# Patient Record
Sex: Female | Born: 1990 | Race: White | Hispanic: No | Marital: Single | Smoking: Current every day smoker
Health system: Southern US, Community
[De-identification: ages and names within clinical notes are randomized; demographics above are authoritative.]

## PROBLEM LIST (undated history)

## (undated) DIAGNOSIS — I10 Essential (primary) hypertension: Secondary | ICD-10-CM

## (undated) DIAGNOSIS — F32A Depression, unspecified: Secondary | ICD-10-CM

## (undated) DIAGNOSIS — M797 Fibromyalgia: Secondary | ICD-10-CM

## (undated) DIAGNOSIS — Z8742 Personal history of other diseases of the female genital tract: Secondary | ICD-10-CM

## (undated) DIAGNOSIS — Z8659 Personal history of other mental and behavioral disorders: Secondary | ICD-10-CM

## (undated) DIAGNOSIS — J45909 Unspecified asthma, uncomplicated: Secondary | ICD-10-CM

## (undated) DIAGNOSIS — Z8719 Personal history of other diseases of the digestive system: Secondary | ICD-10-CM

## (undated) DIAGNOSIS — E049 Nontoxic goiter, unspecified: Secondary | ICD-10-CM

## (undated) DIAGNOSIS — M1990 Unspecified osteoarthritis, unspecified site: Secondary | ICD-10-CM

## (undated) DIAGNOSIS — E079 Disorder of thyroid, unspecified: Secondary | ICD-10-CM

## (undated) DIAGNOSIS — K219 Gastro-esophageal reflux disease without esophagitis: Secondary | ICD-10-CM

## (undated) DIAGNOSIS — F419 Anxiety disorder, unspecified: Secondary | ICD-10-CM

## (undated) DIAGNOSIS — G43909 Migraine, unspecified, not intractable, without status migrainosus: Secondary | ICD-10-CM

## (undated) HISTORY — DX: Nontoxic goiter, unspecified: E04.9

## (undated) HISTORY — PX: ABDOMINAL HYSTERECTOMY: SHX81

## (undated) HISTORY — DX: Disorder of thyroid, unspecified: E07.9

## (undated) HISTORY — DX: Personal history of other mental and behavioral disorders: Z86.59

## (undated) HISTORY — DX: Unspecified osteoarthritis, unspecified site: M19.90

## (undated) HISTORY — DX: Personal history of other diseases of the female genital tract: Z87.42

## (undated) HISTORY — PX: HERNIA REPAIR: SHX51

## (undated) HISTORY — DX: Gastro-esophageal reflux disease without esophagitis: K21.9

## (undated) HISTORY — PX: KNEE SURGERY: SHX244

## (undated) HISTORY — DX: Fibromyalgia: M79.7

## (undated) HISTORY — DX: Unspecified asthma, uncomplicated: J45.909

## (undated) HISTORY — DX: Migraine, unspecified, not intractable, without status migrainosus: G43.909

## (undated) HISTORY — DX: Personal history of other diseases of the digestive system: Z87.19

## (undated) HISTORY — DX: Anxiety disorder, unspecified: F41.9

## (undated) HISTORY — DX: Depression, unspecified: F32.A

---

## 2014-05-25 ENCOUNTER — Other Ambulatory Visit: Payer: Self-pay | Admitting: Orthopedic Surgery

## 2014-05-25 DIAGNOSIS — S8992XA Unspecified injury of left lower leg, initial encounter: Secondary | ICD-10-CM

## 2014-05-26 ENCOUNTER — Ambulatory Visit
Admission: RE | Admit: 2014-05-26 | Discharge: 2014-05-26 | Disposition: A | Discharge status: Home / Self Care | Payer: Medicaid Other | Source: Ambulatory Visit | Attending: Orthopedic Surgery | Admitting: Orthopedic Surgery

## 2014-05-26 DIAGNOSIS — S8992XA Unspecified injury of left lower leg, initial encounter: Secondary | ICD-10-CM

## 2016-01-24 IMAGING — CT CT KNEE*L* W/O CM
2 of 4 series · 4 of 14 positions shown, 5 images · non-contrast
Comparison: None.

CLINICAL DATA: Hyperextension injury left knee while trying to put
on his shoe on 05/06/2014. Onset of right knee pain. The patient
heard a pop.

EXAM:
CT OF THE LEFT KNEE WITHOUT CONTRAST
TECHNIQUE: Multidetector CT imaging of the left knee was performed according to
the standard protocol. Multiplanar CT image reconstructions were
also generated.

[Series 4: knee bone · axial · 0.32mm/px · z∈[-66,+4]mm · 2 of 84 slices shown, 3 images]
[im 28/84  soft-tissue]
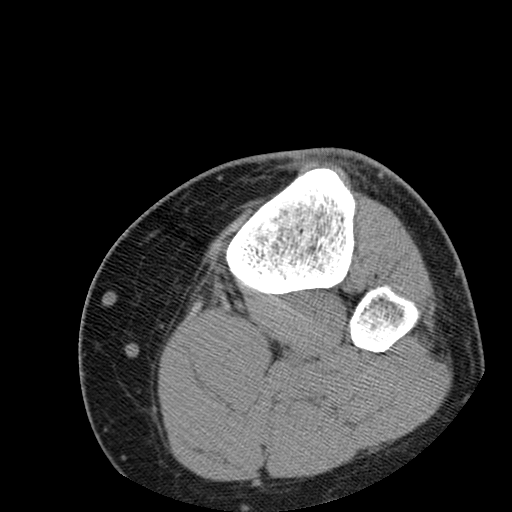
[im 28/84  bone]
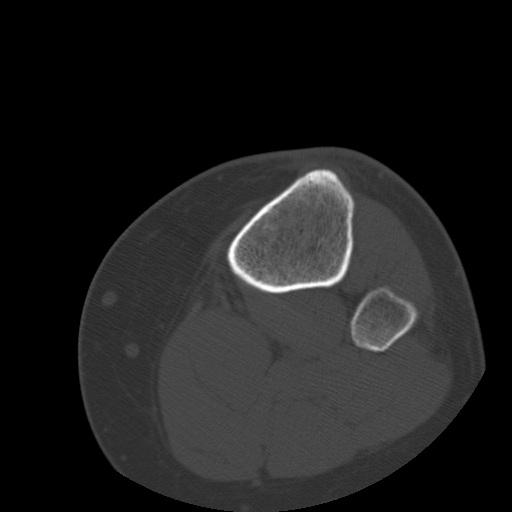
[im 56/84  bone]
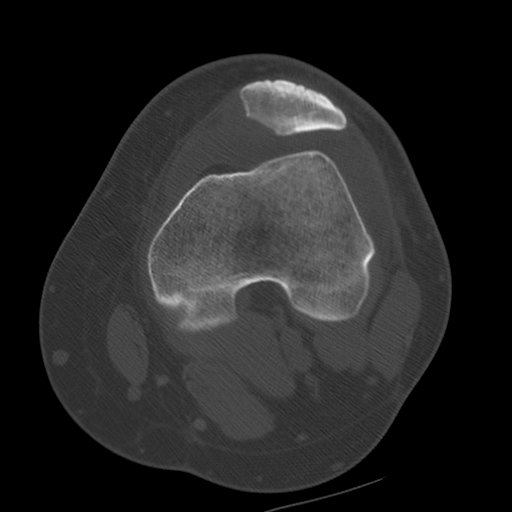

[Series 5: knee soft · axial · 0.32mm/px · z∈[-64,+6]mm · 2 of 85 slices shown]
[im 29/85  soft-tissue]
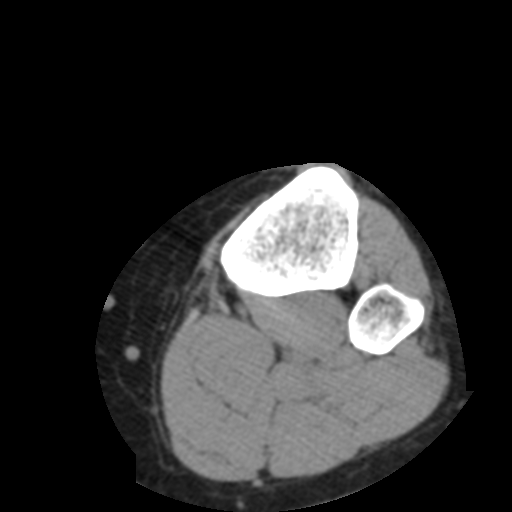
[im 57/85  soft-tissue]
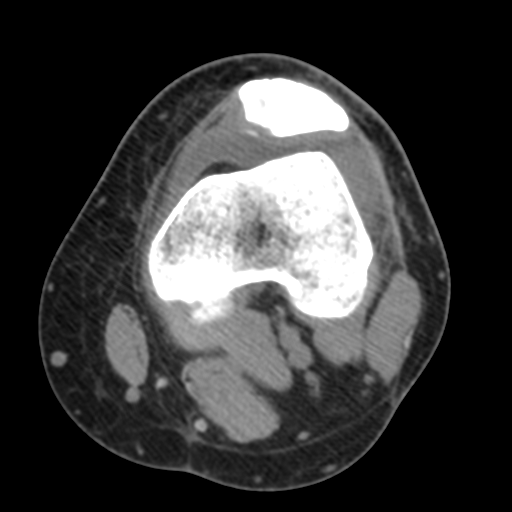

[4 of 14 positions shown; findings below may reference images not displayed]

FINDINGS: A bone fragment is seen along the lateral femoral condyle measuring
1.5 cm AP by 1.3 cm craniocaudal by 0.3 cm transverse. The donor
site is the medial facet of the patella in the mid and lower pole
where a defect is identified. There is lateral tilt of the patella.
The patient has a joint effusion. Except as noted, no fracture is
identified.

The patient has a very shallow trochlear groove. The medial
patellofemoral ligament appears attenuated at its attachment to the
mid and lower pole of the patella. The patellar tendon, quadriceps
tendon and lateral retinaculum are intact. The tibial tubercle,
trochlear groove distance is abnormally increased at 2.1 cm. Is
visualized by CT scan, the menisci and cruciate and collateral
ligaments appear intact.
IMPRESSION: Findings consistent with transient lateral dislocation of the
patella with an associated fracture of the medial patellar facet.
The fracture fragment is positioned along the lateral femoral
trochlea. Shallow trochlear groove and abnormal tibial
tubercle-trochlear groove distance predispose the patient to
patellar dislocation. There may be tearing of the medial
patellofemoral ligament but this would be better evaluated with MRI.

## 2021-06-26 NOTE — Progress Notes (Unsigned)
° °  Office Visit Note  Patient: Linda Gaines             Date of Birth: 1991/01/30           MRN: [id number]             PCP: No primary care provider on file. Referring: Adela Glimpse, NP Visit Date: 07/10/2021 Occupation: @GUAROCC @  Subjective:  No chief complaint on file.   History of Present Illness: Linda Gaines is a 31 y.o. female ***   Activities of Daily Living:  Patient reports morning stiffness for *** {minute/hour:19697}.   Patient {ACTIONS;DENIES/REPORTS:21021675::"Denies"} nocturnal pain.  Difficulty dressing/grooming: {ACTIONS;DENIES/REPORTS:21021675::"Denies"} Difficulty climbing stairs: {ACTIONS;DENIES/REPORTS:21021675::"Denies"} Difficulty getting out of chair: {ACTIONS;DENIES/REPORTS:21021675::"Denies"} Difficulty using hands for taps, buttons, cutlery, and/or writing: {ACTIONS;DENIES/REPORTS:21021675::"Denies"}  No Rheumatology ROS completed.   PMFS History:  There are no problems to display for this patient.   No past medical history on file.  No family history on file. *** The histories are not reviewed yet. Please review them in the "History" navigator section and refresh this SmartLink. Social History   Social History Narrative   Not on file    There is no immunization history on file for this patient.   Objective: Vital Signs: There were no vitals taken for this visit.   Physical Exam   Musculoskeletal Exam: ***  CDAI Exam: CDAI Score: -- Patient Global: --; Provider Global: -- Swollen: --; Tender: -- Joint Exam 07/10/2021   No joint exam has been documented for this visit   There is currently no information documented on the homunculus. Go to the Rheumatology activity and complete the homunculus joint exam.  Investigation: No additional findings.  Imaging: No results found.  Recent Labs: No results found for: WBC, HGB, PLT, NA, K, CL, CO2, GLUCOSE, BUN, CREATININE, BILITOT, ALKPHOS, AST, ALT, PROT, ALBUMIN, CALCIUM, GFRAA,  QFTBGOLD, QFTBGOLDPLUS  Speciality Comments: No specialty comments available.  Procedures:  No procedures performed Allergies: Patient has no allergy information on record.   Assessment / Plan:     Visit Diagnoses: Positive ANA (antinuclear antibody) - 03/26/21: ANA 1:40NH  Thyroid disease  Hepatosplenomegaly  History of diverticulosis  Primary osteoarthritis of left knee  History of bipolar disorder  History of ADHD  PCOS (polycystic ovarian syndrome)  History of gastroesophageal reflux (GERD)  Orders: No orders of the defined types were placed in this encounter.  No orders of the defined types were placed in this encounter.   Face-to-face time spent with patient was *** minutes. Greater than 50% of time was spent in counseling and coordination of care.  Follow-Up Instructions: No follow-ups on file.   14/6/22, PA-C  Note - This record has been created using Dragon software.  Chart creation errors have been sought, but may not always  have been located. Such creation errors do not reflect on  the standard of medical care.

## 2021-07-10 ENCOUNTER — Ambulatory Visit (INDEPENDENT_AMBULATORY_CARE_PROVIDER_SITE_OTHER): Payer: Medicaid Other

## 2021-07-10 ENCOUNTER — Ambulatory Visit (INDEPENDENT_AMBULATORY_CARE_PROVIDER_SITE_OTHER): Payer: Medicaid Other | Admitting: Rheumatology

## 2021-07-10 ENCOUNTER — Encounter: Payer: Self-pay | Admitting: Rheumatology

## 2021-07-10 ENCOUNTER — Other Ambulatory Visit: Payer: Self-pay

## 2021-07-10 ENCOUNTER — Encounter: Payer: Self-pay | Admitting: *Deleted

## 2021-07-10 VITALS — BP 143/76 | HR 88 | Ht 63.0 in | Wt 193.0 lb

## 2021-07-10 DIAGNOSIS — M79672 Pain in left foot: Secondary | ICD-10-CM

## 2021-07-10 DIAGNOSIS — E079 Disorder of thyroid, unspecified: Secondary | ICD-10-CM

## 2021-07-10 DIAGNOSIS — Z8719 Personal history of other diseases of the digestive system: Secondary | ICD-10-CM | POA: Insufficient documentation

## 2021-07-10 DIAGNOSIS — M25562 Pain in left knee: Secondary | ICD-10-CM | POA: Diagnosis not present

## 2021-07-10 DIAGNOSIS — R202 Paresthesia of skin: Secondary | ICD-10-CM

## 2021-07-10 DIAGNOSIS — E282 Polycystic ovarian syndrome: Secondary | ICD-10-CM | POA: Insufficient documentation

## 2021-07-10 DIAGNOSIS — M79671 Pain in right foot: Secondary | ICD-10-CM | POA: Diagnosis not present

## 2021-07-10 DIAGNOSIS — R768 Other specified abnormal immunological findings in serum: Secondary | ICD-10-CM

## 2021-07-10 DIAGNOSIS — M791 Myalgia, unspecified site: Secondary | ICD-10-CM

## 2021-07-10 DIAGNOSIS — G8929 Other chronic pain: Secondary | ICD-10-CM | POA: Diagnosis not present

## 2021-07-10 DIAGNOSIS — M79642 Pain in left hand: Secondary | ICD-10-CM

## 2021-07-10 DIAGNOSIS — R5383 Other fatigue: Secondary | ICD-10-CM

## 2021-07-10 DIAGNOSIS — G4709 Other insomnia: Secondary | ICD-10-CM

## 2021-07-10 DIAGNOSIS — M79641 Pain in right hand: Secondary | ICD-10-CM

## 2021-07-10 DIAGNOSIS — M1712 Unilateral primary osteoarthritis, left knee: Secondary | ICD-10-CM

## 2021-07-10 DIAGNOSIS — Z8639 Personal history of other endocrine, nutritional and metabolic disease: Secondary | ICD-10-CM

## 2021-07-10 DIAGNOSIS — R162 Hepatomegaly with splenomegaly, not elsewhere classified: Secondary | ICD-10-CM | POA: Insufficient documentation

## 2021-07-10 DIAGNOSIS — Z8669 Personal history of other diseases of the nervous system and sense organs: Secondary | ICD-10-CM

## 2021-07-10 DIAGNOSIS — Z8659 Personal history of other mental and behavioral disorders: Secondary | ICD-10-CM | POA: Insufficient documentation

## 2021-07-16 LAB — URINALYSIS, ROUTINE W REFLEX MICROSCOPIC
Bilirubin Urine: NEGATIVE
Glucose, UA: NEGATIVE
Hgb urine dipstick: NEGATIVE
Ketones, ur: NEGATIVE
Leukocytes,Ua: NEGATIVE
Nitrite: NEGATIVE
Protein, ur: NEGATIVE
Specific Gravity, Urine: 1.01 (ref 1.001–1.035)
pH: 6 (ref 5.0–8.0)

## 2021-07-16 LAB — LUPUS ANTICOAGULANT EVAL W/ REFLEX
PTT-LA Screen: 33 s (ref <=40)
dRVVT: 39 s (ref <=45)

## 2021-07-16 LAB — SJOGRENS SYNDROME-A EXTRACTABLE NUCLEAR ANTIBODY: SSA (Ro) (ENA) Antibody, IgG: <1 AI

## 2021-07-16 LAB — CK: Total CK: 117 U/L (ref 29–143)

## 2021-07-16 LAB — BETA-2 GLYCOPROTEIN ANTIBODIES
Beta-2 Glyco 1 IgA: <2 U/mL (ref <20.0)
Beta-2 Glyco 1 IgM: <2 U/mL (ref <20.0)
Beta-2 Glyco I IgG: <2 U/mL (ref <20.0)

## 2021-07-16 LAB — CBC WITH DIFFERENTIAL/PLATELET
Absolute Monocytes: 409 cells/uL (ref 200–950)
Basophils Absolute: 61 cells/uL (ref 0–200)
Basophils Relative: 0.7 %
Eosinophils Absolute: 0 cells/uL — ABNORMAL LOW (ref 15–500)
Eosinophils Relative: 0 %
HCT: 45.4 % — ABNORMAL HIGH (ref 35.0–45.0)
Hemoglobin: 15.3 g/dL (ref 11.7–15.5)
Lymphs Abs: 1496 cells/uL (ref 850–3900)
MCH: 29.9 pg (ref 27.0–33.0)
MCHC: 33.7 g/dL (ref 32.0–36.0)
MCV: 88.8 fL (ref 80.0–100.0)
MPV: 10.2 fL (ref 7.5–12.5)
Monocytes Relative: 4.7 %
Neutro Abs: 6734 cells/uL (ref 1500–7800)
Neutrophils Relative %: 77.4 %
Platelets: 192 10*3/uL (ref 140–400)
RBC: 5.11 10*6/uL — ABNORMAL HIGH (ref 3.80–5.10)
RDW: 13 % (ref 11.0–15.0)
Total Lymphocyte: 17.2 %
WBC: 8.7 10*3/uL (ref 3.8–10.8)

## 2021-07-16 LAB — COMPLETE METABOLIC PANEL WITH GFR
AG Ratio: 2.4 (calc) (ref 1.0–2.5)
ALT: 21 U/L (ref 6–29)
AST: 16 U/L (ref 10–30)
Albumin: 5 g/dL (ref 3.6–5.1)
Alkaline phosphatase (APISO): 52 U/L (ref 31–125)
BUN: 9 mg/dL (ref 7–25)
CO2: 27 mmol/L (ref 20–32)
Calcium: 9.5 mg/dL (ref 8.6–10.2)
Chloride: 106 mmol/L (ref 98–110)
Creat: 0.8 mg/dL (ref 0.50–0.97)
Globulin: 2.1 g/dL (calc) (ref 1.9–3.7)
Glucose, Bld: 88 mg/dL (ref 65–99)
Potassium: 4 mmol/L (ref 3.5–5.3)
Sodium: 140 mmol/L (ref 135–146)
Total Bilirubin: 0.6 mg/dL (ref 0.2–1.2)
Total Protein: 7.1 g/dL (ref 6.1–8.1)
eGFR: 100 mL/min/{1.73_m2} (ref >=60)

## 2021-07-16 LAB — SEDIMENTATION RATE: Sed Rate: 2 mm/h (ref 0–20)

## 2021-07-16 LAB — ANTI-NUCLEAR AB-TITER (ANA TITER): ANA Titer 1: 1:80 {titer} — ABNORMAL HIGH

## 2021-07-16 LAB — EPSTEIN-BARR VIRUS VCA ANTIBODY PANEL
EBV NA IgG: 33.5 U/mL — ABNORMAL HIGH
EBV VCA IgG: >750 U/mL — ABNORMAL HIGH
EBV VCA IgM: <36 U/mL

## 2021-07-16 LAB — ANA: Anti Nuclear Antibody (ANA): POSITIVE — AB

## 2021-07-16 LAB — RNP ANTIBODY: Ribonucleic Protein(ENA) Antibody, IgG: <1 AI

## 2021-07-16 LAB — ANTI-SCLERODERMA ANTIBODY: Scleroderma (Scl-70) (ENA) Antibody, IgG: <1 AI

## 2021-07-16 LAB — C3 AND C4
C3 Complement: 138 mg/dL (ref 83–193)
C4 Complement: 29 mg/dL (ref 15–57)

## 2021-07-16 LAB — TSH: TSH: 1.77 mIU/L

## 2021-07-16 LAB — CYCLIC CITRUL PEPTIDE ANTIBODY, IGG: Cyclic Citrullin Peptide Ab: <16 UNITS

## 2021-07-16 LAB — CARDIOLIPIN ANTIBODIES, IGG, IGM, IGA
Anticardiolipin IgA: <2 APL-U/mL (ref <20.0)
Anticardiolipin IgG: <2 GPL-U/mL (ref <20.0)
Anticardiolipin IgM: <2 MPL-U/mL (ref <20.0)

## 2021-07-16 LAB — ANTI-SMITH ANTIBODY: ENA SM Ab Ser-aCnc: <1 AI

## 2021-07-16 LAB — ANTI-DNA ANTIBODY, DOUBLE-STRANDED: ds DNA Ab: <1 IU/mL

## 2021-07-16 LAB — SJOGRENS SYNDROME-B EXTRACTABLE NUCLEAR ANTIBODY: SSB (La) (ENA) Antibody, IgG: 2.1 AI — AB

## 2021-07-16 LAB — ANGIOTENSIN CONVERTING ENZYME: Angiotensin-Converting Enzyme: 9 U/L (ref 9–67)

## 2021-07-16 LAB — THYROGLOBULIN ANTIBODY: Thyroglobulin Ab: 2 IU/mL — ABNORMAL HIGH (ref <=1)

## 2021-07-16 LAB — THYROID PEROXIDASE ANTIBODY: Thyroperoxidase Ab SerPl-aCnc: 2 IU/mL (ref <9)

## 2021-07-16 LAB — RHEUMATOID FACTOR: Rheumatoid fact SerPl-aCnc: <14 IU/mL (ref <14)

## 2021-07-16 NOTE — Progress Notes (Signed)
I will discuss results at the follow-up visit.

## 2021-07-18 NOTE — Progress Notes (Signed)
? ?Office Visit Note ? ?Patient: Linda Gaines             ?Date of Birth: May 18, 1990           ?MRN: [id number]             ?PCP: Adela Glimpseranford, Tonya, NP ?Referring: No ref. provider found ?Visit Date: 07/31/2021 ?Occupation: @GUAROCC @ ? ?Subjective:  ?Pain in all the joints and muscles ? ?History of Present Illness: Linda Gaines is a 31 y.o. female with history of positive ANA and generalized pain.  She states she continues to have pain in all of her muscles.  She has ongoing pain and discomfort in all of her joints especially in the neck area, around her elbows, bilateral hands, hips, knees and her feet.  She has not noticed any joint swelling.  She continues to have tingling in the tip of her fingers.  She states she has not noticed any discoloration in her hands or feet.  She continues to have chronic insomnia and fatigue. ? ?Activities of Daily Living:  ?Patient reports morning stiffness for 1 hour.   ?Patient Reports nocturnal pain.  ?Difficulty dressing/grooming: Denies ?Difficulty climbing stairs: Reports ?Difficulty getting out of chair: Denies ?Difficulty using hands for taps, buttons, cutlery, and/or writing: Reports ? ?Review of Systems  ?Constitutional:  Positive for fatigue.  ?HENT:  Positive for mouth dryness. Negative for nose dryness.   ?Eyes:  Positive for itching. Negative for pain and dryness.  ?Respiratory:  Negative for difficulty breathing.   ?Cardiovascular:  Negative for chest pain and palpitations.  ?Gastrointestinal:  Positive for constipation. Negative for blood in stool and diarrhea.  ?Endocrine: Negative for increased urination.  ?Genitourinary:  Negative for difficulty urinating.  ?Musculoskeletal:  Positive for joint pain, joint pain, joint swelling, myalgias, morning stiffness, muscle tenderness and myalgias.  ?Skin:  Positive for redness. Negative for color change and rash.  ?Allergic/Immunologic: Negative for susceptible to infections.  ?Neurological:  Positive for numbness and  headaches. Negative for dizziness, memory loss and weakness.  ?Hematological:  Positive for bruising/bleeding tendency.  ?Psychiatric/Behavioral:  Negative for confusion.   ? ?PMFS History:  ?Patient Active Problem List  ? Diagnosis Date Noted  ? History of gastroesophageal reflux (GERD) 07/10/2021  ? PCOS (polycystic ovarian syndrome) 07/10/2021  ? History of ADHD 07/10/2021  ? History of bipolar disorder 07/10/2021  ? History of diverticulosis 07/10/2021  ? Hepatosplenomegaly 07/10/2021  ?  ?Past Medical History:  ?Diagnosis Date  ? History of bipolar disorder   ? History of diverticulosis   ? History of gastroesophageal reflux (GERD)   ? History of PCOS   ? Thyroid disease   ?  ?Family History  ?Problem Relation Age of Onset  ? Hypertension Mother   ? High Cholesterol Mother   ? Diabetes Mother   ? Anxiety disorder Mother   ? High Cholesterol Father   ? Hypertension Father   ? Healthy Son   ? Healthy Son   ? Healthy Daughter   ? Fainting Daughter   ? Anxiety disorder Daughter   ? Depression Daughter   ? Healthy Daughter   ? ?Past Surgical History:  ?Procedure Laterality Date  ? ABDOMINAL HYSTERECTOMY    ? CESAREAN SECTION    ? x3  ? HERNIA REPAIR    ? KNEE SURGERY Left   ? ?Social History  ? ?Social History Narrative  ? Not on file  ? ? ?There is no immunization history on file for this patient.  ? ?Objective: ?  Vital Signs: BP 133/82 (BP Location: Left Arm, Patient Position: Sitting, Cuff Size: Large)   Pulse 92   Resp 13   Ht 5\' 3"  (1.6 m)   Wt 197 lb 3.2 oz (89.4 kg)   BMI 34.93 kg/m?   ? ?Physical Exam ?Vitals and nursing note reviewed.  ?Constitutional:   ?   Appearance: She is well-developed.  ?HENT:  ?   Head: Normocephalic and atraumatic.  ?Eyes:  ?   Conjunctiva/sclera: Conjunctivae normal.  ?Cardiovascular:  ?   Rate and Rhythm: Normal rate and regular rhythm.  ?   Heart sounds: Normal heart sounds.  ?Pulmonary:  ?   Effort: Pulmonary effort is normal.  ?   Breath sounds: Normal breath sounds.   ?Abdominal:  ?   General: Bowel sounds are normal.  ?   Palpations: Abdomen is soft.  ?Musculoskeletal:  ?   Cervical back: Normal range of motion.  ?Lymphadenopathy:  ?   Cervical: No cervical adenopathy.  ?Skin: ?   General: Skin is warm and dry.  ?   Capillary Refill: Capillary refill takes less than 2 seconds.  ?Neurological:  ?   Mental Status: She is alert and oriented to person, place, and time.  ?Psychiatric:     ?   Behavior: Behavior normal.  ?  ? ?Musculoskeletal Exam: C-spine, thoracic and lumbar spine with good range of motion.  Shoulder joints, elbow joints, wrist joints, MCPs PIPs and DIPs with good range of motion with no synovitis.  Hip joints, knee joints, ankles, MTPs and PIPs with good range of motion with no synovitis.  She had bilateral trapezius spasm.  She had tenderness over bilateral lateral epicondyle region, costochondral region, trochanteric area, medial aspect of her knees. ? ?CDAI Exam: ?CDAI Score: -- ?Patient Global: --; Provider Global: -- ?Swollen: --; Tender: -- ?Joint Exam 07/31/2021  ? ?No joint exam has been documented for this visit  ? ?There is currently no information documented on the homunculus. Go to the Rheumatology activity and complete the homunculus joint exam. ? ?Investigation: ?No additional findings. ? ?Imaging: ?XR Foot 2 Views Left ? ?Result Date: 07/10/2021 ?No MTP, PIP or DIP narrowing was noted.  No intertarsal, tibiotalar or subtalar joint space narrowing was noted.  Inferior and posterior calcaneal spurs were noted. Impression: X-rays of the foot was unremarkable except for inferior and posterior calcaneal spurs. ? ?XR Foot 2 Views Right ? ?Result Date: 07/10/2021 ?No MTP, PIP or DIP narrowing was noted.  No intertarsal, tibiotalar or subtalar joint space narrowing was noted.  Inferior and posterior calcaneal spurs were noted. Impression: X-rays of the foot was unremarkable except for inferior and posterior calcaneal spurs. ? ?XR Hand 2 View Left ? ?Result  Date: 07/10/2021 ?No CMC, MCP, PIP or DIP narrowing was noted.  Cortical defect was noted in the fifth proximal phalanx due to previous injury.  No intercarpal or radiocarpal joint space narrowing was noted.  No erosive changes were noted. Impression: Unremarkable x-ray of the hand except for the posttraumatic changes. ? ?XR Hand 2 View Right ? ?Result Date: 07/10/2021 ?No CMC, PIP, DIP, MCP, intercarpal or radiocarpal joint space narrowing was noted.  No erosive changes were noted. Impression: Unremarkable x-ray of the hand. ? ?XR KNEE 3 VIEW LEFT ? ?Result Date: 07/10/2021 ?No medial or lateral compartment narrowing was noted.  No patellofemoral narrowing was noted.  No chondrocalcinosis was noted. Impression: Unremarkable x-ray of the knee joint.  ? ?Recent Labs: ?Lab Results  ?Component Value Date  ?  WBC 8.7 07/10/2021  ? HGB 15.3 07/10/2021  ? PLT 192 07/10/2021  ? NA 140 07/10/2021  ? K 4.0 07/10/2021  ? CL 106 07/10/2021  ? CO2 27 07/10/2021  ? GLUCOSE 88 07/10/2021  ? BUN 9 07/10/2021  ? CREATININE 0.80 07/10/2021  ? BILITOT 0.6 07/10/2021  ? AST 16 07/10/2021  ? ALT 21 07/10/2021  ? PROT 7.1 07/10/2021  ? CALCIUM 9.5 07/10/2021  ? ?July 10, 2021 ANA 1: 80NS, SSB antibody positive, (SSA, double-stranded DNA, Smith, RNP, SCL 70 negative), C3-C4 normal, anticardiolipin negative, beta-2 GP 1 negative, lupus anticoagulant negative, RF negative, anti-CCP negative, ACE 9, EBV IgG positive, IgM negative, TPO negative, thyroglobulin antibody positive, TSH normal, CK117 ? ?Speciality Comments: No specialty comments available. ? ?Procedures:  ?No procedures performed ?Allergies: Penicillin g and Amoxicillin  ? ?Assessment / Plan:     ?Visit Diagnoses: Positive ANA (antinuclear antibody) - +ANA, +La,h/o fatigue, arthralgias, intermittent rash. -I did usual discussion with the patient regarding her labs.  She has low titer ANA and positive SSB antibody.  SSA antibody is negative.  I do not see any synovitis on  examination.  She had good capillary refill with no nailbed capillary changes.  No telangiectasias were noted.  At this point I do not see any features of autoimmune disease.  She has history of mild sicca symptoms which is mo

## 2021-07-31 ENCOUNTER — Ambulatory Visit (INDEPENDENT_AMBULATORY_CARE_PROVIDER_SITE_OTHER): Payer: Medicaid Other | Admitting: Rheumatology

## 2021-07-31 ENCOUNTER — Encounter: Payer: Self-pay | Admitting: Rheumatology

## 2021-07-31 VITALS — BP 133/82 | HR 92 | Resp 13 | Ht 63.0 in | Wt 197.2 lb

## 2021-07-31 DIAGNOSIS — R768 Other specified abnormal immunological findings in serum: Secondary | ICD-10-CM

## 2021-07-31 DIAGNOSIS — M79671 Pain in right foot: Secondary | ICD-10-CM

## 2021-07-31 DIAGNOSIS — M79641 Pain in right hand: Secondary | ICD-10-CM | POA: Diagnosis not present

## 2021-07-31 DIAGNOSIS — R202 Paresthesia of skin: Secondary | ICD-10-CM

## 2021-07-31 DIAGNOSIS — Z8719 Personal history of other diseases of the digestive system: Secondary | ICD-10-CM

## 2021-07-31 DIAGNOSIS — M79642 Pain in left hand: Secondary | ICD-10-CM

## 2021-07-31 DIAGNOSIS — E282 Polycystic ovarian syndrome: Secondary | ICD-10-CM

## 2021-07-31 DIAGNOSIS — Z8669 Personal history of other diseases of the nervous system and sense organs: Secondary | ICD-10-CM

## 2021-07-31 DIAGNOSIS — Z8659 Personal history of other mental and behavioral disorders: Secondary | ICD-10-CM

## 2021-07-31 DIAGNOSIS — M2242 Chondromalacia patellae, left knee: Secondary | ICD-10-CM

## 2021-07-31 DIAGNOSIS — Z8269 Family history of other diseases of the musculoskeletal system and connective tissue: Secondary | ICD-10-CM

## 2021-07-31 DIAGNOSIS — M7918 Myalgia, other site: Secondary | ICD-10-CM

## 2021-07-31 DIAGNOSIS — R162 Hepatomegaly with splenomegaly, not elsewhere classified: Secondary | ICD-10-CM

## 2021-07-31 DIAGNOSIS — Z8639 Personal history of other endocrine, nutritional and metabolic disease: Secondary | ICD-10-CM

## 2021-07-31 DIAGNOSIS — G4709 Other insomnia: Secondary | ICD-10-CM

## 2021-07-31 DIAGNOSIS — R5383 Other fatigue: Secondary | ICD-10-CM

## 2021-07-31 DIAGNOSIS — M79672 Pain in left foot: Secondary | ICD-10-CM

## 2021-07-31 DIAGNOSIS — M797 Fibromyalgia: Secondary | ICD-10-CM

## 2021-07-31 NOTE — Patient Instructions (Signed)
Please get labs 2 weeks prior to your next appointment. ? ?Knee Exercises ?Ask your health care provider which exercises are safe for you. Do exercises exactly as told by your health care provider and adjust them as directed. It is normal to feel mild stretching, pulling, tightness, or discomfort as you do these exercises. Stop right away if you feel sudden pain or your pain gets worse. Do not begin these exercises until told by your health care provider. ?Stretching and range-of-motion exercises ?These exercises warm up your muscles and joints and improve the movement and flexibility of your knee. These exercises also help to relieve pain and swelling. ?Knee extension, prone ? ?Lie on your abdomen (prone position) on a bed. ?Place your left / right knee just beyond the edge of the surface so your knee is not on the bed. You can put a towel under your left / right thigh just above your kneecap for comfort. ?Relax your leg muscles and allow gravity to straighten your knee (extension). You should feel a stretch behind your left / right knee. ?Hold this position for __________ seconds. ?Scoot up so your knee is supported between repetitions. ?Repeat __________ times. Complete this exercise __________ times a day. ?Knee flexion, active ? ?Lie on your back with both legs straight. If this causes back discomfort, bend your left / right knee so your foot is flat on the floor. ?Slowly slide your left / right heel back toward your buttocks. Stop when you feel a gentle stretch in the front of your knee or thigh (flexion). ?Hold this position for __________ seconds. ?Slowly slide your left / right heel back to the starting position. ?Repeat __________ times. Complete this exercise __________ times a day. ?Quadriceps stretch, prone ? ?Lie on your abdomen on a firm surface, such as a bed or padded floor. ?Bend your left / right knee and hold your ankle. If you cannot reach your ankle or pant leg, loop a belt around your foot and  grab the belt instead. ?Gently pull your heel toward your buttocks. Your knee should not slide out to the side. You should feel a stretch in the front of your thigh and knee (quadriceps). ?Hold this position for __________ seconds. ?Repeat __________ times. Complete this exercise __________ times a day. ?Hamstring, supine ? ?Lie on your back (supine position). ?Loop a belt or towel over the ball of your left / right foot. The ball of your foot is on the walking surface, right under your toes. ?Straighten your left / right knee and slowly pull on the belt to raise your leg until you feel a gentle stretch behind your knee (hamstring). ?Do not let your knee bend while you do this. ?Keep your other leg flat on the floor. ?Hold this position for __________ seconds. ?Repeat __________ times. Complete this exercise __________ times a day. ?Strengthening exercises ?These exercises build strength and endurance in your knee. Endurance is the ability to use your muscles for a long time, even after they get tired. ?Quadriceps, isometric ?This exercise strengthens the muscles in front of your thigh (quadriceps) without moving your knee joint (isometric). ?Lie on your back with your left / right leg extended and your other knee bent. Put a rolled towel or small pillow under your knee if told by your health care provider. ?Slowly tense the muscles in the front of your left / right thigh. You should see your kneecap slide up toward your hip or see increased dimpling just above the knee. This  motion will push the back of the knee toward the floor. ?For __________ seconds, hold the muscle as tight as you can without increasing your pain. ?Relax the muscles slowly and completely. ?Repeat __________ times. Complete this exercise __________ times a day. ?Straight leg raises ?This exercise strengthens the muscles in front of your thigh (quadriceps) and the muscles that move your hips (hip flexors). ?Lie on your back with your left /  right leg extended and your other knee bent. ?Tense the muscles in the front of your left / right thigh. You should see your kneecap slide up or see increased dimpling just above the knee. Your thigh may even shake a bit. ?Keep these muscles tight as you raise your leg 4-6 inches (10-15 cm) off the floor. Do not let your knee bend. ?Hold this position for __________ seconds. ?Keep these muscles tense as you lower your leg. ?Relax your muscles slowly and completely after each repetition. ?Repeat __________ times. Complete this exercise __________ times a day. ?Hamstring, isometric ? ?Lie on your back on a firm surface. ?Bend your left / right knee about __________ degrees. ?Dig your left / right heel into the surface as if you are trying to pull it toward your buttocks. Tighten the muscles in the back of your thighs (hamstring) to "dig" as hard as you can without increasing any pain. ?Hold this position for __________ seconds. ?Release the tension gradually and allow your muscles to relax completely for __________ seconds after each repetition. ?Repeat __________ times. Complete this exercise __________ times a day. ?Hamstring curls ?If told by your health care provider, do this exercise while wearing ankle weights. Begin with __________lb / kg weights. Then increase the weight by 1 lb (0.5 kg) increments. Do not wear ankle weights that are more than __________lb / kg. ?Lie on your abdomen with your legs straight. ?Bend your left / right knee as far as you can without feeling pain. Keep your hips flat against the floor. ?Hold this position for __________ seconds. ?Slowly lower your leg to the starting position. ?Repeat __________ times. Complete this exercise __________ times a day. ?Squats ?This exercise strengthens the muscles in front of your thigh and knee (quadriceps). ?Stand in front of a table, with your feet and knees pointing straight ahead. You may rest your hands on the table for balance but not for  support. ?Slowly bend your knees and lower your hips like you are going to sit in a chair. ?Keep your weight over your heels, not over your toes. ?Keep your lower legs upright so they are parallel with the table legs. ?Do not let your hips go lower than your knees. ?Do not bend lower than told by your health care provider. ?If your knee pain increases, do not bend as low. ?Hold the squat position for __________ seconds. ?Slowly push with your legs to return to standing. Do not use your hands to pull yourself to standing. ?Repeat __________ times. Complete this exercise __________ times a day. ?Wall slides ?This exercise strengthens the muscles in front of your thigh and knee (quadriceps). ?Lean your back against a smooth wall or door, and walk your feet out 18-24 inches (46-61 cm) from it. ?Place your feet hip-width apart. ?Slowly slide down the wall or door until your knees bend __________ degrees. Keep your knees over your heels, not over your toes. Keep your knees in line with your hips. ?Hold this position for __________ seconds. ?Repeat __________ times. Complete this exercise __________ times a day. ?  Straight leg raises, side-lying ?This exercise strengthens the muscles that rotate the leg at the hip and move it away from your body (hip abductors). ?Lie on your side with your left / right leg in the top position. Lie so your head, shoulder, knee, and hip line up. You may bend your bottom knee to help you keep your balance. ?Roll your hips slightly forward so your hips are stacked directly over each other and your left / right knee is facing forward. ?Leading with your heel, lift your top leg 4-6 inches (10-15 cm). You should feel the muscles in your outer hip lifting. ?Do not let your foot drift forward. ?Do not let your knee roll toward the ceiling. ?Hold this position for __________ seconds. ?Slowly return your leg to the starting position. ?Let your muscles relax completely after each repetition. ?Repeat  __________ times. Complete this exercise __________ times a day. ?Straight leg raises, prone ?This exercise stretches the muscles that move your hips away from the front of the pelvis (hip extensors). ?Lie on yo

## 2021-08-06 ENCOUNTER — Other Ambulatory Visit: Payer: Self-pay | Admitting: Hematology and Oncology

## 2021-08-06 DIAGNOSIS — R161 Splenomegaly, not elsewhere classified: Secondary | ICD-10-CM

## 2021-08-07 ENCOUNTER — Ambulatory Visit: Payer: Medicaid Other | Admitting: Hematology and Oncology

## 2021-08-07 ENCOUNTER — Inpatient Hospital Stay (INDEPENDENT_AMBULATORY_CARE_PROVIDER_SITE_OTHER): Payer: Medicaid Other | Admitting: Hematology and Oncology

## 2021-08-07 ENCOUNTER — Encounter: Payer: Self-pay | Admitting: Hematology and Oncology

## 2021-08-07 ENCOUNTER — Other Ambulatory Visit: Payer: Medicaid Other

## 2021-08-07 ENCOUNTER — Other Ambulatory Visit: Payer: Self-pay

## 2021-08-07 ENCOUNTER — Inpatient Hospital Stay: Payer: Medicaid Other | Attending: Hematology and Oncology

## 2021-08-07 VITALS — BP 137/65 | HR 72 | Temp 98.1°F | Resp 18 | Ht 63.0 in | Wt 196.8 lb

## 2021-08-07 DIAGNOSIS — M797 Fibromyalgia: Secondary | ICD-10-CM | POA: Diagnosis not present

## 2021-08-07 DIAGNOSIS — R161 Splenomegaly, not elsewhere classified: Secondary | ICD-10-CM

## 2021-08-08 ENCOUNTER — Encounter: Payer: Self-pay | Admitting: Hematology and Oncology

## 2021-08-08 LAB — BASIC METABOLIC PANEL
BUN: 12 (ref 4–21)
CO2: 22 (ref 13–22)
Chloride: 105 (ref 99–108)
Creatinine: 0.7 (ref 0.5–1.1)
Glucose: 97
Potassium: 3.8 mEq/L (ref 3.5–5.1)
Sodium: 139 (ref 137–147)

## 2021-08-08 LAB — HEPATIC FUNCTION PANEL
ALT: 26 U/L (ref 7–35)
AST: 20 (ref 13–35)
Alkaline Phosphatase: 43 (ref 25–125)
Bilirubin, Total: 0.6

## 2021-08-08 LAB — CBC: RBC: 4.88 (ref 3.87–5.11)

## 2021-08-08 LAB — COMPREHENSIVE METABOLIC PANEL
Albumin: 4.6 (ref 3.5–5.0)
Calcium: 9.2 (ref 8.7–10.7)

## 2021-08-08 LAB — CBC AND DIFFERENTIAL
HCT: 42 (ref 36–46)
Hemoglobin: 14.4 (ref 12.0–16.0)
Neutrophils Absolute: 7.17
Platelets: 211 10*3/uL (ref 150–400)
WBC: 10.1

## 2021-08-08 NOTE — Progress Notes (Cosign Needed)
[facility]  [address] [place],  [place]  [address] [phone]  Clinic Day:  08/08/2021  Referring physician: Pollyann Savoy, MD   REASON FOR CONSULTATION:  Hepatosplenomegaly  HISTORY OF PRESENT ILLNESS:  Linda Gaines is a 30 y.o. female with a history of hepatosplenomegaly who is referred in consultation by Pollyann Savoy, MD for assessment and management. Linda Gaines has had history of enlarged liver and spleen dating back to June 2021. Most recent imaging was last month after a fall down the stairs. Results from this CT revealed hepatomegaly measuring 21.1 cm with no suspicious hepatic lesion and similar splenomegaly measuring 14 cm without splenic injury of perisplenic hematoma. She was evaluated by GI with no specific recommendations. She was also evaluated by rheumatology for a positive ANA and found to have no autoimmune disorder.   Today, she denies fever, chills, nausea or vomiting. She denies shortness of breath, chest pain, or cough. She denies issue with bowel or bladder. Medical history is significant for thyroid disease, ADHD, bipolar, depression, obesity, osteoarthritis of the left knee, PCOS, diverticulosis, and GERD. Surgical history is significant for c-section, hernia surgery, left knee surgery and hysterectomy. She does not report any family history. She has 4 children and lives with her aunt. She is a current every day smoker at 1 ppd.    REVIEW OF SYSTEMS:  Review of Systems  Constitutional:  Negative for appetite change, chills, diaphoresis, fatigue, fever and unexpected weight change.  HENT:   Negative for hearing loss, lump/mass, mouth sores, nosebleeds, sore throat, tinnitus, trouble swallowing and voice change.   Eyes:  Negative for eye problems and icterus.  Respiratory:  Negative for chest tightness, cough, hemoptysis, shortness of breath and wheezing.   Cardiovascular:  Negative for chest pain, leg swelling and  palpitations.  Gastrointestinal:  Negative for abdominal distention, abdominal pain, blood in stool, constipation, diarrhea, nausea, rectal pain and vomiting.  Endocrine: Negative for hot flashes.  Genitourinary:  Negative for bladder incontinence, difficulty urinating, dyspareunia, dysuria, frequency, hematuria and nocturia.   Musculoskeletal:  Positive for arthralgias and myalgias. Negative for back pain, flank pain, gait problem, neck pain and neck stiffness.  Skin:  Negative for itching, rash and wound.  Neurological:  Negative for dizziness, extremity weakness, gait problem, headaches, light-headedness, numbness, seizures and speech difficulty.  Hematological:  Negative for adenopathy. Does not bruise/bleed easily.  Psychiatric/Behavioral:  Negative for confusion, decreased concentration, depression, sleep disturbance and suicidal ideas. The patient is not nervous/anxious.     VITALS:  Blood pressure 137/65, pulse 72, temperature 98.1 F (36.7 C), temperature source Oral, resp. rate 18, height 5\' 3"  (1.6 m), weight 196 lb 12.8 oz (89.3 kg), SpO2 98 %.  Wt Readings from Last 3 Encounters:  08/07/21 196 lb 12.8 oz (89.3 kg)  07/31/21 197 lb 3.2 oz (89.4 kg)  07/10/21 193 lb (87.5 kg)    Body mass index is 34.86 kg/m.  Performance status (ECOG): 1 - Symptomatic but completely ambulatory  PHYSICAL EXAM:  Physical Exam Constitutional:      General: She is not in acute distress.    Appearance: Normal appearance. She is normal weight. She is not ill-appearing, toxic-appearing or diaphoretic.  HENT:     Head: Normocephalic and atraumatic.     Right Ear: Tympanic membrane normal.     Left Ear: Tympanic membrane normal.     Nose: Nose normal. No congestion or rhinorrhea.     Mouth/Throat:     Mouth: Mucous membranes  are moist.     Pharynx: Oropharynx is clear. No oropharyngeal exudate or posterior oropharyngeal erythema.  Eyes:     General: No scleral icterus.       Right eye: No  discharge.        Left eye: No discharge.     Extraocular Movements: Extraocular movements intact.     Conjunctiva/sclera: Conjunctivae normal.     Pupils: Pupils are equal, round, and reactive to light.  Neck:     Vascular: No carotid bruit.  Cardiovascular:     Rate and Rhythm: Normal rate and regular rhythm.     Heart sounds: No murmur heard.   No friction rub. No gallop.  Pulmonary:     Effort: Pulmonary effort is normal. No respiratory distress.     Breath sounds: Normal breath sounds. No stridor. No wheezing, rhonchi or rales.  Chest:     Chest wall: No tenderness.  Abdominal:     General: Abdomen is flat. Bowel sounds are normal. There is no distension.     Palpations: There is no mass.     Tenderness: There is no abdominal tenderness. There is no right CVA tenderness, left CVA tenderness, guarding or rebound.     Hernia: No hernia is present.  Musculoskeletal:        General: No swelling, tenderness, deformity or signs of injury. Normal range of motion.     Cervical back: Normal range of motion and neck supple. No rigidity or tenderness.     Right lower leg: No edema.     Left lower leg: No edema.  Lymphadenopathy:     Cervical: No cervical adenopathy.  Skin:    General: Skin is warm and dry.     Capillary Refill: Capillary refill takes less than 2 seconds.     Coloration: Skin is not jaundiced or pale.     Findings: No bruising, erythema, lesion or rash.  Neurological:     General: No focal deficit present.     Mental Status: She is alert and oriented to person, place, and time. Mental status is at baseline.     Cranial Nerves: No cranial nerve deficit.     Sensory: No sensory deficit.     Motor: No weakness.     Coordination: Coordination normal.     Gait: Gait normal.     Deep Tendon Reflexes: Reflexes normal.  Psychiatric:        Mood and Affect: Mood normal.        Behavior: Behavior normal.        Thought Content: Thought content normal.        Judgment:  Judgment normal.     LABS:      Latest Ref Rng & Units 08/08/2021   12:00 AM 07/10/2021    9:17 AM  CBC  WBC  10.1      8.7    Hemoglobin 12.0 - 16.0 14.4      15.3    Hematocrit 36 - 46 42      45.4    Platelets 150 - 400 K/uL 211      192       This result is from an external source.      Latest Ref Rng & Units 08/08/2021   12:00 AM 07/10/2021    9:17 AM  CMP  Glucose 65 - 99 mg/dL  88    BUN 4 - 21 12      9     Creatinine 0.5 -  1.1 0.7      0.80    Sodium 137 - 147 139      140    Potassium 3.5 - 5.1 mEq/L 3.8      4.0    Chloride 99 - 108 105      106    CO2 13 - 22 22      27     Calcium 8.7 - 10.7 9.2      9.5    Total Protein 6.1 - 8.1 g/dL  7.1    Total Bilirubin 0.2 - 1.2 mg/dL  0.6    Alkaline Phos 25 - 125 43        AST 13 - 35 20      16    ALT 7 - 35 U/L 26      21       This result is from an external source.     No results found for: CEA1 / No results found for: CEA1 No results found for: PSA1 No results found for: MWN027 No results found for: CAN125  No results found for: TOTALPROTELP, ALBUMINELP, A1GS, A2GS, BETS, BETA2SER, GAMS, MSPIKE, SPEI No results found for: TIBC, FERRITIN, IRONPCTSAT No results found for: LDH  STUDIES:  XR Foot 2 Views Left  Result Date: 07/10/2021 No MTP, PIP or DIP narrowing was noted.  No intertarsal, tibiotalar or subtalar joint space narrowing was noted.  Inferior and posterior calcaneal spurs were noted. Impression: X-rays of the foot was unremarkable except for inferior and posterior calcaneal spurs.  XR Foot 2 Views Right  Result Date: 07/10/2021 No MTP, PIP or DIP narrowing was noted.  No intertarsal, tibiotalar or subtalar joint space narrowing was noted.  Inferior and posterior calcaneal spurs were noted. Impression: X-rays of the foot was unremarkable except for inferior and posterior calcaneal spurs.  XR Hand 2 View Left  Result Date: 07/10/2021 No CMC, MCP, PIP or DIP narrowing was noted.  Cortical defect  was noted in the fifth proximal phalanx due to previous injury.  No intercarpal or radiocarpal joint space narrowing was noted.  No erosive changes were noted. Impression: Unremarkable x-ray of the hand except for the posttraumatic changes.  XR Hand 2 View Right  Result Date: 07/10/2021 No CMC, PIP, DIP, MCP, intercarpal or radiocarpal joint space narrowing was noted.  No erosive changes were noted. Impression: Unremarkable x-ray of the hand.  XR KNEE 3 VIEW LEFT  Result Date: 07/10/2021 No medial or lateral compartment narrowing was noted.  No patellofemoral narrowing was noted.  No chondrocalcinosis was noted. Impression: Unremarkable x-ray of the knee joint.     HISTORY:   Past Medical History:  Diagnosis Date   Anxiety    Asthma    Depression    Fibromyalgia    GERD (gastroesophageal reflux disease)    Goiter    History of bipolar disorder    History of diverticulosis    History of gastroesophageal reflux (GERD)    History of PCOS    Migraines    Osteoarthritis    Thyroid disease     Past Surgical History:  Procedure Laterality Date   ABDOMINAL HYSTERECTOMY     CESAREAN SECTION     x3   HERNIA REPAIR     KNEE SURGERY Left     Family History  Problem Relation Age of Onset   Hypertension Mother    High Cholesterol Mother    Diabetes Mother    Anxiety disorder Mother  High Cholesterol Father    Hypertension Father    Healthy Son    Healthy Son    Healthy Daughter    Fainting Daughter    Anxiety disorder Daughter    Depression Daughter    Healthy Daughter     Social History:  reports that she has been smoking cigarettes. She has been smoking an average of 1 pack per day. She has never been exposed to tobacco smoke. She has never used smokeless tobacco. She reports that she does not currently use alcohol. She reports that she does not use drugs.The patient is alone  today.  Allergies:  Allergies  Allergen Reactions   Penicillin G Anaphylaxis, Rash and  Swelling   Amoxicillin     Current Medications: Current Outpatient Medications  Medication Sig Dispense Refill   AIMOVIG 70 MG/ML SOAJ SMARTSIG:1 SUB-Q Once a Month     ALPRAZolam (NIRAVAM) 0.25 MG dissolvable tablet Take by mouth.     amphetamine-dextroamphetamine (ADDERALL) 30 MG tablet Take 1 tablet by mouth 2 (two) times daily.     FLUoxetine (PROZAC) 40 MG capsule Take 40 mg by mouth daily.     lisinopril (ZESTRIL) 5 MG tablet Take 5 mg by mouth daily.     montelukast (SINGULAIR) 10 MG tablet      ondansetron (ZOFRAN-ODT) 4 MG disintegrating tablet Take 4 mg by mouth 2 (two) times daily as needed.     pantoprazole (PROTONIX) 40 MG tablet Take 40 mg by mouth daily.     propranolol (INDERAL) 40 MG tablet Take 40 mg by mouth daily.     SYMBICORT 160-4.5 MCG/ACT inhaler SMARTSIG:2 Puff(s) By Mouth Twice Daily     VENTOLIN HFA 108 (90 Base) MCG/ACT inhaler SMARTSIG:1-2 Puff(s) By Mouth Every 4-6 Hours PRN     VRAYLAR 1.5 MG capsule Take 1.5 mg by mouth daily.     No current facility-administered medications for this visit.     ASSESSMENT & PLAN:   Assessment:  Linda Gaines is a 31 y.o. female with a long standing history of hepatosplenomegaly dating back to 2021. Images have been stable from June 2021 to March 2023. CBC and CMP today are unremarkable. We discussed at length the potential causes for an enlarged liver including alcohol abuse, hepatitis or cancer. She has none of these risk factors at present. We discussed concerning symptoms such as vomiting blood or yellowing of the skin and eyes. We also discussed an enlarged spleen and the potential causes including infection or cancer. Again, she has no risk factors and has had workup in the past for lupus which is negative. We discussed concerning symptoms including trauma to the abdomen, increasing abdominal pain and fever. She did inquire about surgical intervention and we discussed the pros and cons. She will be getting routine  scans with her PCP.  Plan: 1.  She will continue follow up with her PCP, GI and rheumatologist. We will see her back on an as needed basis.   I discussed the assessment and treatment plan with the patient.  The patient was provided an opportunity to ask questions and all were answered.  The patient agreed with the plan and demonstrated an understanding of the instructions.  The patient was advised to call back if the symptoms worsen or if the condition fails to improve as anticipated.  Thank you for the opportunity      Pascal Lux, NP

## 2022-01-09 NOTE — Progress Notes (Deleted)
Office Visit Note  Patient: Linda Gaines             Date of Birth: [birth year]           MRN: [id number]             PCP: [facility] Referring: [facility] Visit Date: 01/23/2022 Occupation: @GUAROCC @  Subjective:  No chief complaint on file.   History of Present Illness: Linda Gaines is a 31 y.o. female ***   Activities of Daily Living:  Patient reports morning stiffness for *** {minute/hour:19697}.   Patient {ACTIONS;DENIES/REPORTS:21021675::"Denies"} nocturnal pain.  Difficulty dressing/grooming: {ACTIONS;DENIES/REPORTS:21021675::"Denies"} Difficulty climbing stairs: {ACTIONS;DENIES/REPORTS:21021675::"Denies"} Difficulty getting out of chair: {ACTIONS;DENIES/REPORTS:21021675::"Denies"} Difficulty using hands for taps, buttons, cutlery, and/or writing: {ACTIONS;DENIES/REPORTS:21021675::"Denies"}  No Rheumatology ROS completed.   PMFS History:  Patient Active Problem List   Diagnosis Date Noted  . Fibromyalgia 08/07/2021  . History of gastroesophageal reflux (GERD) 07/10/2021  . PCOS (polycystic ovarian syndrome) 07/10/2021  . History of ADHD 07/10/2021  . History of bipolar disorder 07/10/2021  . History of diverticulosis 07/10/2021  . Hepatosplenomegaly 07/10/2021    Past Medical History:  Diagnosis Date  . Anxiety   . Asthma   . Depression   . Fibromyalgia   . GERD (gastroesophageal reflux disease)   . Goiter   . History of bipolar disorder   . History of diverticulosis   . History of gastroesophageal reflux (GERD)   . History of PCOS   . Migraines   . Osteoarthritis   . Thyroid disease     Family History  Problem Relation Age of Onset  . Hypertension Mother   . High Cholesterol Mother   . Diabetes Mother   . Anxiety disorder Mother   . High Cholesterol Father   . Hypertension Father   . Healthy Son   . Healthy Son   . Healthy Daughter   . Fainting Daughter   . Anxiety disorder Daughter    . Depression Daughter   . Healthy Daughter    Past Surgical History:  Procedure Laterality Date  . ABDOMINAL HYSTERECTOMY    . CESAREAN SECTION     x3  . HERNIA REPAIR    . KNEE SURGERY Left    Social History   Social History Narrative  . Not on file    There is no immunization history on file for this patient.   Objective: Vital Signs: There were no vitals taken for this visit.   Physical Exam   Musculoskeletal Exam: ***  CDAI Exam: CDAI Score: -- Patient Global: --; Provider Global: -- Swollen: --; Tender: -- Joint Exam 01/23/2022   No joint exam has been documented for this visit   There is currently no information documented on the homunculus. Go to the Rheumatology activity and complete the homunculus joint exam.  Investigation: No additional findings.  Imaging: No results found.  Recent Labs: Lab Results  Component Value Date   WBC 10.1 08/08/2021   HGB 14.4 08/08/2021   PLT 211 08/08/2021   NA 139 08/08/2021   K 3.8 08/08/2021   CL 105 08/08/2021   CO2 22 08/08/2021   GLUCOSE 88 07/10/2021   BUN 12 08/08/2021   CREATININE 0.7 08/08/2021   BILITOT 0.6 07/10/2021   ALKPHOS 43 08/08/2021   AST 20 08/08/2021   ALT 26 08/08/2021   PROT 7.1 07/10/2021   ALBUMIN 4.6 08/08/2021   CALCIUM 9.2 08/08/2021    Speciality Comments: No specialty comments available.  Procedures:  No procedures performed Allergies: Penicillin g and Amoxicillin   Assessment / Plan:     Visit Diagnoses: No diagnosis found.  Orders: No orders of the defined types were placed in this encounter.  No orders of the defined types were placed in this encounter.   Face-to-face time spent with patient was *** minutes. Greater than 50% of time was spent in counseling and coordination of care.  Follow-Up Instructions: No follow-ups on file.   Earnestine Mealing, CMA  Note - This record has been created using Editor, commissioning.  Chart creation errors have been sought, but  may not always  have been located. Such creation errors do not reflect on  the standard of medical care.

## 2022-01-23 ENCOUNTER — Ambulatory Visit: Payer: Medicaid Other | Attending: Rheumatology | Admitting: Rheumatology

## 2022-01-23 DIAGNOSIS — M79672 Pain in left foot: Secondary | ICD-10-CM

## 2022-01-23 DIAGNOSIS — M79642 Pain in left hand: Secondary | ICD-10-CM

## 2022-01-23 DIAGNOSIS — R768 Other specified abnormal immunological findings in serum: Secondary | ICD-10-CM

## 2022-01-23 DIAGNOSIS — Z8639 Personal history of other endocrine, nutritional and metabolic disease: Secondary | ICD-10-CM

## 2022-01-23 DIAGNOSIS — R5383 Other fatigue: Secondary | ICD-10-CM

## 2022-01-23 DIAGNOSIS — Z8269 Family history of other diseases of the musculoskeletal system and connective tissue: Secondary | ICD-10-CM

## 2022-01-23 DIAGNOSIS — Z8719 Personal history of other diseases of the digestive system: Secondary | ICD-10-CM

## 2022-01-23 DIAGNOSIS — M2242 Chondromalacia patellae, left knee: Secondary | ICD-10-CM

## 2022-01-23 DIAGNOSIS — Z8659 Personal history of other mental and behavioral disorders: Secondary | ICD-10-CM

## 2022-01-23 DIAGNOSIS — M797 Fibromyalgia: Secondary | ICD-10-CM

## 2022-01-23 DIAGNOSIS — Z8669 Personal history of other diseases of the nervous system and sense organs: Secondary | ICD-10-CM

## 2022-01-23 DIAGNOSIS — R202 Paresthesia of skin: Secondary | ICD-10-CM

## 2022-01-23 DIAGNOSIS — E282 Polycystic ovarian syndrome: Secondary | ICD-10-CM

## 2022-01-23 DIAGNOSIS — R162 Hepatomegaly with splenomegaly, not elsewhere classified: Secondary | ICD-10-CM

## 2022-01-23 DIAGNOSIS — G4709 Other insomnia: Secondary | ICD-10-CM

## 2022-07-21 NOTE — Progress Notes (Deleted)
Office Visit Note  Patient: Linda Gaines             Date of Birth: [birth year]           MRN: [id number]             PCP: [facility] Referring: Darrol Jump, NP Visit Date: 07/31/2022 Occupation: @GUAROCC @  Subjective:  No chief complaint on file.   History of Present Illness: Linda Gaines is a 32 y.o. female ***     Activities of Daily Living:  Patient reports morning stiffness for *** {minute/hour:19697}.   Patient {ACTIONS;DENIES/REPORTS:21021675::"Denies"} nocturnal pain.  Difficulty dressing/grooming: {ACTIONS;DENIES/REPORTS:21021675::"Denies"} Difficulty climbing stairs: {ACTIONS;DENIES/REPORTS:21021675::"Denies"} Difficulty getting out of chair: {ACTIONS;DENIES/REPORTS:21021675::"Denies"} Difficulty using hands for taps, buttons, cutlery, and/or writing: {ACTIONS;DENIES/REPORTS:21021675::"Denies"}  No Rheumatology ROS completed.   PMFS History:  Patient Active Problem List   Diagnosis Date Noted   Fibromyalgia 08/07/2021   History of gastroesophageal reflux (GERD) 07/10/2021   PCOS (polycystic ovarian syndrome) 07/10/2021   History of ADHD 07/10/2021   History of bipolar disorder 07/10/2021   History of diverticulosis 07/10/2021   Hepatosplenomegaly 07/10/2021    Past Medical History:  Diagnosis Date   Anxiety    Asthma    Depression    Fibromyalgia    GERD (gastroesophageal reflux disease)    Goiter    History of bipolar disorder    History of diverticulosis    History of gastroesophageal reflux (GERD)    History of PCOS    Migraines    Osteoarthritis    Thyroid disease     Family History  Problem Relation Age of Onset   Hypertension Mother    High Cholesterol Mother    Diabetes Mother    Anxiety disorder Mother    High Cholesterol Father    Hypertension Father    Healthy Son    Healthy Son    Healthy Daughter    Fainting Daughter    Anxiety disorder Daughter    Depression Daughter     Healthy Daughter    Past Surgical History:  Procedure Laterality Date   ABDOMINAL HYSTERECTOMY     CESAREAN SECTION     x3   HERNIA REPAIR     KNEE SURGERY Left    Social History   Social History Narrative   Not on file    There is no immunization history on file for this patient.   Objective: Vital Signs: There were no vitals taken for this visit.   Physical Exam   Musculoskeletal Exam: ***  CDAI Exam: CDAI Score: -- Patient Global: --; Provider Global: -- Swollen: --; Tender: -- Joint Exam 07/31/2022   No joint exam has been documented for this visit   There is currently no information documented on the homunculus. Go to the Rheumatology activity and complete the homunculus joint exam.  Investigation: No additional findings.  Imaging: No results found.  Recent Labs: Lab Results  Component Value Date   WBC 10.1 08/08/2021   HGB 14.4 08/08/2021   PLT 211 08/08/2021   NA 139 08/08/2021   K 3.8 08/08/2021   CL 105 08/08/2021   CO2 22 08/08/2021   GLUCOSE 88 07/10/2021   BUN 12 08/08/2021   CREATININE 0.7 08/08/2021   BILITOT 0.6 07/10/2021   ALKPHOS 43 08/08/2021   AST 20 08/08/2021   ALT 26 08/08/2021   PROT 7.1 07/10/2021   ALBUMIN 4.6 08/08/2021   CALCIUM 9.2 08/08/2021    July 10, 2021 EBV IgG  positive, IgM negative, ANA 1: 80NS, SSB 2.1, (SCL 70, RNP, Smith, SSA, dsDNA negative), C3-C4 normal, anticardiolipin negative, beta-2 GP 1 negative, lupus anticoagulant negative, CK1 17, TSH normal, sed rate 2, RF negative, anti-CCP negative, TPO negative, thyroglobulin antibody 2, ACE 9  Speciality Comments: No specialty comments available.  Procedures:  No procedures performed Allergies: Penicillin g and Amoxicillin   Assessment / Plan:     Visit Diagnoses: Positive ANA (antinuclear antibody) - Positive ANA, positive SSB history of fatigue, arthralgia, intermittent rash.  Pain in both hands - X-rays were unremarkable.  Paresthesia of both hands  - Patient is planning to see a neurologist.  Chondromalacia patellae, left knee - Mild chondromalacia patella was noted.  Patient was referred to physical therapy.  Pain in both feet - X-rays were unremarkable except for calcaneal spurs.  Fibromyalgia - Patient was referred to physical therapy at the last visit.  Other insomnia  Other fatigue  History of diverticulosis  History of gastroesophageal reflux (GERD)  Hepatosplenomegaly  History of ADHD  History of bipolar disorder  History of migraine  History of goiter - Positive thyroglobulin antibody  PCOS (polycystic ovarian syndrome)  Family history of systemic lupus erythematosus-first cousin  Orders: No orders of the defined types were placed in this encounter.  No orders of the defined types were placed in this encounter.   Face-to-face time spent with patient was *** minutes. Greater than 50% of time was spent in counseling and coordination of care.  Follow-Up Instructions: No follow-ups on file.   Bo Merino, MD  Note - This record has been created using Editor, commissioning.  Chart creation errors have been sought, but may not always  have been located. Such creation errors do not reflect on  the standard of medical care.

## 2022-07-31 ENCOUNTER — Ambulatory Visit: Payer: Medicaid Other | Admitting: Rheumatology

## 2022-07-31 DIAGNOSIS — E282 Polycystic ovarian syndrome: Secondary | ICD-10-CM

## 2022-07-31 DIAGNOSIS — Z8639 Personal history of other endocrine, nutritional and metabolic disease: Secondary | ICD-10-CM

## 2022-07-31 DIAGNOSIS — Z8719 Personal history of other diseases of the digestive system: Secondary | ICD-10-CM

## 2022-07-31 DIAGNOSIS — M79671 Pain in right foot: Secondary | ICD-10-CM

## 2022-07-31 DIAGNOSIS — M2242 Chondromalacia patellae, left knee: Secondary | ICD-10-CM

## 2022-07-31 DIAGNOSIS — Z8659 Personal history of other mental and behavioral disorders: Secondary | ICD-10-CM

## 2022-07-31 DIAGNOSIS — G4709 Other insomnia: Secondary | ICD-10-CM

## 2022-07-31 DIAGNOSIS — Z8669 Personal history of other diseases of the nervous system and sense organs: Secondary | ICD-10-CM

## 2022-07-31 DIAGNOSIS — Z8269 Family history of other diseases of the musculoskeletal system and connective tissue: Secondary | ICD-10-CM

## 2022-07-31 DIAGNOSIS — R5383 Other fatigue: Secondary | ICD-10-CM

## 2022-07-31 DIAGNOSIS — R202 Paresthesia of skin: Secondary | ICD-10-CM

## 2022-07-31 DIAGNOSIS — M79641 Pain in right hand: Secondary | ICD-10-CM

## 2022-07-31 DIAGNOSIS — R162 Hepatomegaly with splenomegaly, not elsewhere classified: Secondary | ICD-10-CM

## 2022-07-31 DIAGNOSIS — R768 Other specified abnormal immunological findings in serum: Secondary | ICD-10-CM

## 2022-07-31 DIAGNOSIS — M797 Fibromyalgia: Secondary | ICD-10-CM

## 2022-08-12 NOTE — Progress Notes (Signed)
Office Visit Note  Patient: Linda Gaines             Date of Birth: December 06, 1990           MRN: [id number]             PCP: [facility] Referring: Adela Glimpse, NP Visit Date: 08/26/2022 Occupation: @GUAROCC @  Subjective:  Pain in joints and muscles  History of Present Illness: Linda Gaines is a 32 y.o. female with history of positive ANA and fibromyalgia syndrome.  She returns today after her last visit on July 31, 2021.  She states she has been having increased pain in her hips which goes down into her legs and also pain in her lower abdominal region.  She continues to have discomfort in her neck and trapezius region.  She also has lower back pain which has been going on for many years.  She states she could not go for physical therapy.  She states her hands have been tingling and get numb.  She had an appointment with the neurology for evaluation.  She has to schedule a follow-up appointment.  She gives history of dry mouth, joint pain and muscle pain.  She has been experiencing pain and discomfort in her right knee joint for the last 6 months.  She denies any Raynaud's phenomenon.  She continues to smoke but she has cut back.    Activities of Daily Living:  Patient reports morning stiffness for 1 hour.   Patient Reports nocturnal pain.  Difficulty dressing/grooming: Denies Difficulty climbing stairs: Reports Difficulty getting out of chair: Reports Difficulty using hands for taps, buttons, cutlery, and/or writing: Reports  Review of Systems  Constitutional:  Positive for fatigue.  HENT:  Positive for mouth dryness. Negative for mouth sores.   Eyes:  Negative for dryness.  Respiratory:  Negative for shortness of breath.   Cardiovascular:  Negative for chest pain and palpitations.  Gastrointestinal:  Negative for blood in stool, constipation and diarrhea.  Endocrine: Negative for increased urination.  Genitourinary:  Negative for  difficulty urinating.  Musculoskeletal:  Positive for joint pain, joint pain, myalgias, morning stiffness, muscle tenderness and myalgias.  Skin:  Positive for sensitivity to sunlight. Negative for color change and rash.  Allergic/Immunologic: Negative for susceptible to infections.  Neurological:  Negative for headaches.  Hematological:  Negative for swollen glands.  Psychiatric/Behavioral:  Positive for sleep disturbance. Negative for depressed mood. The patient is nervous/anxious.     PMFS History:  Patient Active Problem List   Diagnosis Date Noted   Fibromyalgia 08/07/2021   History of gastroesophageal reflux (GERD) 07/10/2021   PCOS (polycystic ovarian syndrome) 07/10/2021   History of ADHD 07/10/2021   History of bipolar disorder 07/10/2021   History of diverticulosis 07/10/2021   Hepatosplenomegaly 07/10/2021    Past Medical History:  Diagnosis Date   Anxiety    Asthma    Depression    Fibromyalgia    GERD (gastroesophageal reflux disease)    Goiter    History of bipolar disorder    History of diverticulosis    History of gastroesophageal reflux (GERD)    History of PCOS    Migraines    Osteoarthritis    Thyroid disease     Family History  Problem Relation Age of Onset   Hypertension Mother    High Cholesterol Mother    Diabetes Mother    Anxiety disorder Mother    High Cholesterol Father    Hypertension  Father    Healthy Son    Healthy Son    Healthy Daughter    Fainting Daughter    Anxiety disorder Daughter    Depression Daughter    Healthy Daughter    Past Surgical History:  Procedure Laterality Date   ABDOMINAL HYSTERECTOMY     CESAREAN SECTION     x3   HERNIA REPAIR     KNEE SURGERY Left    Social History   Social History Narrative   Not on file    There is no immunization history on file for this patient.   Objective: Vital Signs: BP 123/85 (BP Location: Left Arm, Patient Position: Sitting, Cuff Size: Large)   Pulse 81   Resp 17    Ht 5\' 3"  (1.6 m)   Wt 200 lb (90.7 kg)   BMI 35.43 kg/m    Physical Exam Vitals and nursing note reviewed.  Constitutional:      Appearance: She is well-developed.  HENT:     Head: Normocephalic and atraumatic.  Eyes:     Conjunctiva/sclera: Conjunctivae normal.  Cardiovascular:     Rate and Rhythm: Normal rate and regular rhythm.     Heart sounds: Normal heart sounds.  Pulmonary:     Effort: Pulmonary effort is normal.     Breath sounds: Normal breath sounds.  Abdominal:     General: Bowel sounds are normal.     Palpations: Abdomen is soft.  Musculoskeletal:     Cervical back: Normal range of motion.  Lymphadenopathy:     Cervical: No cervical adenopathy.  Skin:    General: Skin is warm and dry.     Capillary Refill: Capillary refill takes less than 2 seconds.  Neurological:     Mental Status: She is alert and oriented to person, place, and time.  Psychiatric:        Behavior: Behavior normal.      Musculoskeletal Exam: She had painful range of motion of the cervical spine with tenderness over bilateral trapezius region.  She had painful range of motion of her lumbar spine.  Shoulder joints, elbow joints, wrist joints, MCPs PIPs and DIPs with good range of motion with no synovitis.  Hip joints and knee joints were in good range of motion.  She had painful range of motion of the right knee joint without any warmth swelling or effusion.  There was no tenderness over ankles or MTPs.  She had generalized hyperalgesia and positive tender points.  CDAI Exam: CDAI Score: -- Patient Global: --; Provider Global: -- Swollen: --; Tender: -- Joint Exam 08/26/2022   No joint exam has been documented for this visit   There is currently no information documented on the homunculus. Go to the Rheumatology activity and complete the homunculus joint exam.  Investigation: No additional findings.  Imaging: No results found.  Recent Labs: Lab Results  Component Value Date   WBC  10.1 08/08/2021   HGB 14.4 08/08/2021   PLT 211 08/08/2021   NA 139 08/08/2021   K 3.8 08/08/2021   CL 105 08/08/2021   CO2 22 08/08/2021   GLUCOSE 88 07/10/2021   BUN 12 08/08/2021   CREATININE 0.7 08/08/2021   BILITOT 0.6 07/10/2021   ALKPHOS 43 08/08/2021   AST 20 08/08/2021   ALT 26 08/08/2021   PROT 7.1 07/10/2021   ALBUMIN 4.6 08/08/2021   CALCIUM 9.2 08/08/2021    Speciality Comments: No specialty comments available.  Procedures:  Trigger Point Inj  Date/Time: 08/26/2022  10:59 AM  Performed by: Pollyann Savoy, MD Authorized by: Pollyann Savoy, MD   Consent Given by:  Patient Site marked: the procedure site was marked   Timeout: prior to procedure the correct patient, procedure, and site was verified   Indications:  Muscle spasm and pain Total # of Trigger Points:  2 Location: neck   Needle Size:  27 G Approach:  Dorsal Medications #1:  0.5 mL lidocaine 1 %; 10 mg triamcinolone acetonide 40 MG/ML Medications #2:  0.5 mL lidocaine 1 %; 10 mg triamcinolone acetonide 40 MG/ML Patient tolerance:  Patient tolerated the procedure well with no immediate complications  Allergies: Penicillin g and Amoxicillin   Assessment / Plan:     Visit Diagnoses: Positive ANA (antinuclear antibody) - +ANA, +La,h/o fatigue, arthralgias, intermittent rash. -She returns today after her last visit approximately a year ago.  She gives history of fatigue, dry mouth, arthralgias and myalgias.  There is no history of raynaud's phenominon, lymphadenopathy.  She gives history of photosensitivity.  Plan: Protein / creatinine ratio, urine, CBC with Differential/Platelet, COMPLETE METABOLIC PANEL WITH GFR, Anti-DNA antibody, double-stranded, C3 and C4, Sedimentation rate, ANA, Sjogrens syndrome-B extractable nuclear antibody, Sjogrens syndrome-A extractable nuclear antibody  Pain in both hands -she complains of pain and discomfort in her bilateral hands.  No synovitis was noted.  X-rays of  bilateral hands were unremarkable in the past.  Paresthesia of both hands-patient was evaluated by neurology.  Patient states she did not schedule a follow-up appointment.  I advised her to schedule a follow-up appointment with the neurologist.  Chronic pain of right knee -she has been experiencing increased pain and discomfort in her right knee joint.  No warmth swelling or effusion was noted.  Plan: XR KNEE 3 VIEW RIGHT.  X-rays of the right knee joint were unremarkable.  X-ray findings were reviewed with the patient.  Chondromalacia patellae, left knee - X-ray of the knee joint showed mild chondromalacia patella in the past.  She had knee joint surgery by [facility] in the past.  Pain in both feet -she continues to have discomfort in her feet.  No tenderness or synovitis was noted.  X-rays were unremarkable except for calcaneal spurs.  Trapezius muscle spasm-she had bilateral trapezius spasm.  Patient has been having lot of discomfort in the cervical region.  I discussed the option of trigger point injections.  Side effects were reviewed.  Patient wanted to proceed with the injections.  After informed consent was obtained bilateral trapezius region were injected with lidocaine and Kenalog as described above.  She tolerated the procedure well.  Postprocedure instructions were given.  Neck pain -she complains of increased discomfort in her neck.  Plan: XR Cervical Spine 2 or 3 views.  X-rays of the cervical spine were unremarkable.  X-ray findings were reviewed with the patient.  I will refer her to physical therapy.  Chronic midline low back pain without sciatica -she has been experience increased lower back pain.  She will benefit from physical therapy.  Plan: XR Lumbar Spine 2-3 Views x-rays of the lumbar spine were unremarkable except for mild levoscoliosis.  X-ray findings were reviewed with the patient.  She will benefit from physical therapy.  Fibromyalgia - History of myalgias, generalized  pain, positive tender points.  She has generalized pain and discomfort from fibromyalgia.  I advised her to follow-up with the PCP regarding the management of fibromyalgia.  I will also refer her to physical therapy.    Other insomnia-good sleep hygiene was  discussed.  Other fatigue - History of chronic fatigue.  Most like related to fibromyalgia and chronic insomnia.  Hepatosplenomegaly - She is followed at [facility] gastroenterology.  Other medical problems are listed as follows:  History of diverticulosis  History of gastroesophageal reflux (GERD)  History of migraine  History of ADHD  History of bipolar disorder  History of goiter - she has positive thyroglobulin antibody.  TSH was normal.  PCOS (polycystic ovarian syndrome)  Family history of systemic lupus erythematosus - first cousin.  It is not unusual to see autoimmune antibodies in the relatives of lupus patients.  Orders: Orders Placed This Encounter  Procedures   Trigger Point Inj   XR Cervical Spine 2 or 3 views   XR Lumbar Spine 2-3 Views   XR KNEE 3 VIEW RIGHT   Protein / creatinine ratio, urine   CBC with Differential/Platelet   COMPLETE METABOLIC PANEL WITH GFR   Anti-DNA antibody, double-stranded   C3 and C4   Sedimentation rate   ANA   Sjogrens syndrome-B extractable nuclear antibody   Sjogrens syndrome-A extractable nuclear antibody   Ambulatory referral to Physical Therapy   No orders of the defined types were placed in this encounter.    Follow-Up Instructions: Return in about 2 months (around 10/26/2022) for Polyarthralgia, positive ANA, fibromyalgia.   Pollyann Savoy, MD  Note - This record has been created using Animal nutritionist.  Chart creation errors have been sought, but may not always  have been located. Such creation errors do not reflect on  the standard of medical care.

## 2022-08-26 ENCOUNTER — Ambulatory Visit: Payer: Medicaid Other | Attending: Rheumatology | Admitting: Rheumatology

## 2022-08-26 ENCOUNTER — Encounter: Payer: Self-pay | Admitting: Rheumatology

## 2022-08-26 ENCOUNTER — Ambulatory Visit (INDEPENDENT_AMBULATORY_CARE_PROVIDER_SITE_OTHER): Payer: Medicaid Other

## 2022-08-26 VITALS — BP 123/85 | HR 81 | Resp 17 | Ht 63.0 in | Wt 200.0 lb

## 2022-08-26 DIAGNOSIS — R162 Hepatomegaly with splenomegaly, not elsewhere classified: Secondary | ICD-10-CM

## 2022-08-26 DIAGNOSIS — Z8669 Personal history of other diseases of the nervous system and sense organs: Secondary | ICD-10-CM

## 2022-08-26 DIAGNOSIS — M25561 Pain in right knee: Secondary | ICD-10-CM

## 2022-08-26 DIAGNOSIS — M79671 Pain in right foot: Secondary | ICD-10-CM

## 2022-08-26 DIAGNOSIS — M2242 Chondromalacia patellae, left knee: Secondary | ICD-10-CM

## 2022-08-26 DIAGNOSIS — Z8659 Personal history of other mental and behavioral disorders: Secondary | ICD-10-CM

## 2022-08-26 DIAGNOSIS — R202 Paresthesia of skin: Secondary | ICD-10-CM | POA: Diagnosis not present

## 2022-08-26 DIAGNOSIS — M79641 Pain in right hand: Secondary | ICD-10-CM | POA: Diagnosis not present

## 2022-08-26 DIAGNOSIS — E282 Polycystic ovarian syndrome: Secondary | ICD-10-CM

## 2022-08-26 DIAGNOSIS — M545 Low back pain, unspecified: Secondary | ICD-10-CM

## 2022-08-26 DIAGNOSIS — R768 Other specified abnormal immunological findings in serum: Secondary | ICD-10-CM | POA: Diagnosis not present

## 2022-08-26 DIAGNOSIS — G8929 Other chronic pain: Secondary | ICD-10-CM

## 2022-08-26 DIAGNOSIS — Z8639 Personal history of other endocrine, nutritional and metabolic disease: Secondary | ICD-10-CM

## 2022-08-26 DIAGNOSIS — M542 Cervicalgia: Secondary | ICD-10-CM

## 2022-08-26 DIAGNOSIS — R5383 Other fatigue: Secondary | ICD-10-CM

## 2022-08-26 DIAGNOSIS — M79642 Pain in left hand: Secondary | ICD-10-CM

## 2022-08-26 DIAGNOSIS — G4709 Other insomnia: Secondary | ICD-10-CM

## 2022-08-26 DIAGNOSIS — Z8269 Family history of other diseases of the musculoskeletal system and connective tissue: Secondary | ICD-10-CM

## 2022-08-26 DIAGNOSIS — Z8719 Personal history of other diseases of the digestive system: Secondary | ICD-10-CM

## 2022-08-26 DIAGNOSIS — M62838 Other muscle spasm: Secondary | ICD-10-CM

## 2022-08-26 DIAGNOSIS — M797 Fibromyalgia: Secondary | ICD-10-CM

## 2022-08-26 DIAGNOSIS — M79672 Pain in left foot: Secondary | ICD-10-CM

## 2022-08-26 MED ORDER — TRIAMCINOLONE ACETONIDE 40 MG/ML IJ SUSP
10.0000 mg | INTRAMUSCULAR | Status: AC | PRN
Start: 2022-08-26 — End: 2022-08-26
  Administered 2022-08-26: 10 mg via INTRAMUSCULAR

## 2022-08-26 MED ORDER — LIDOCAINE HCL 1 % IJ SOLN
0.5000 mL | INTRAMUSCULAR | Status: AC | PRN
Start: 2022-08-26 — End: 2022-08-26
  Administered 2022-08-26: .5 mL

## 2022-08-26 NOTE — Patient Instructions (Addendum)
Cervical Strain and Sprain Rehab Ask your health care provider which exercises are safe for you. Do exercises exactly as told by your health care provider and adjust them as directed. It is normal to feel mild stretching, pulling, tightness, or discomfort as you do these exercises. Stop right away if you feel sudden pain or your pain gets worse. Do not begin these exercises until told by your health care provider. Stretching and range-of-motion exercises Cervical side bending  Using good posture, sit on a stable chair or stand up. Without moving your shoulders, slowly tilt your left / right ear to your shoulder until you feel a stretch in the neck muscles on the opposite side. You should be looking straight ahead. Hold for __________ seconds. Repeat with the other side of your neck. Repeat __________ times. Complete this exercise __________ times a day. Cervical rotation  Using good posture, sit on a stable chair or stand up. Slowly turn your head to the side as if you are looking over your left / right shoulder. Keep your eyes level with the ground. Stop when you feel a stretch along the side and the back of your neck. Hold for __________ seconds. Repeat this by turning to your other side. Repeat __________ times. Complete this exercise __________ times a day. Thoracic extension and pectoral stretch  Roll a towel or a small blanket so it is about 4 inches (10 cm) in diameter. Lie down on your back on a firm surface. Put the towel in the middle of your back across your spine. It should not be under your shoulder blades. Put your hands behind your head and let your elbows fall out to your sides. Hold for __________ seconds. Repeat __________ times. Complete this exercise __________ times a day. Strengthening exercises Upper cervical flexion  Lie on your back with a thin pillow behind your head or a small, rolled-up towel under your neck. Gently tuck your chin toward your chest and nod  your head down to look toward your feet. Do not lift your head off the pillow. Hold for __________ seconds. Release the tension slowly. Relax your neck muscles completely before you repeat this exercise. Repeat __________ times. Complete this exercise __________ times a day. Cervical extension  Stand about 6 inches (15 cm) away from a wall, with your back facing the wall. Place a soft object, about 6-8 inches (15-20 cm) in diameter, between the back of your head and the wall. A soft object could be a small pillow, a ball, or a folded towel. Gently tilt your head back and press into the soft object. Keep your jaw and forehead relaxed. Hold for __________ seconds. Release the tension slowly. Relax your neck muscles completely before you repeat this exercise. Repeat __________ times. Complete this exercise __________ times a day. Posture and body mechanics Body mechanics refer to the movements and positions of your body while you do your daily activities. Posture is part of body mechanics. Good posture and healthy body mechanics can help to relieve stress in your body's tissues and joints. Good posture means that your spine is in its natural S-curve position (your spine is neutral), your shoulders are pulled back slightly, and your head is not tipped forward. The following are general guidelines for using improved posture and body mechanics in your everyday activities. Sitting  When sitting, keep your spine neutral and keep your feet flat on the floor. Use a footrest, if needed, and keep your thighs parallel to the floor. Avoid rounding   your shoulders. Avoid tilting your head forward. When working at a desk or a computer, keep your desk at a height where your hands are slightly lower than your elbows. Slide your chair under your desk so you are close enough to maintain good posture. When working at a computer, place your monitor at a height where you are looking straight ahead and you do not have to  tilt your head forward or downward to look at the screen. Standing  When standing, keep your spine neutral and keep your feet about hip-width apart. Keep a slight bend in your knees. Your ears, shoulders, and hips should line up. When you do a task in which you stand in one place for a long time, place one foot up on a stable object that is 2-4 inches (5-10 cm) high, such as a footstool. This helps keep your spine neutral. Resting When lying down and resting, avoid positions that are most painful for you. Try to support your neck in a neutral position. You can use a contour pillow or a small rolled-up towel. Your pillow should support your neck but not push on it. This information is not intended to replace advice given to you by your health care provider. Make sure you discuss any questions you have with your health care provider. Document Revised: 10/28/2021 Document Reviewed: 10/28/2021 Elsevier Patient Education  2023 [employer] Back Exercises The following exercises strengthen the muscles that help to support the trunk (torso) and back. They also help to keep the lower back flexible. Doing these exercises can help to prevent or lessen existing low back pain. If you have back pain or discomfort, try doing these exercises 2-3 times each day or as told by your health care provider. As your pain improves, do them once each day, but increase the number of times that you repeat the steps for each exercise (do more repetitions). To prevent the recurrence of back pain, continue to do these exercises once each day or as told by your health care provider. Do exercises exactly as told by your health care provider and adjust them as directed. It is normal to feel mild stretching, pulling, tightness, or discomfort as you do these exercises, but you should stop right away if you feel sudden pain or your pain gets worse. Exercises Single knee to chest Repeat these steps 3-5 times for each leg: Lie on  your back on a firm bed or the floor with your legs extended. Bring one knee to your chest. Your other leg should stay extended and in contact with the floor. Hold your knee in place by grabbing your knee or thigh with both hands and hold. Pull on your knee until you feel a gentle stretch in your lower back or buttocks. Hold the stretch for 10-30 seconds. Slowly release and straighten your leg.  Pelvic tilt Repeat these steps 5-10 times: Lie on your back on a firm bed or the floor with your legs extended. Bend your knees so they are pointing toward the ceiling and your feet are flat on the floor. Tighten your lower abdominal muscles to press your lower back against the floor. This motion will tilt your pelvis so your tailbone points up toward the ceiling instead of pointing to your feet or the floor. With gentle tension and even breathing, hold this position for 5-10 seconds.  Cat-cow Repeat these steps until your lower back becomes more flexible: Get into a hands-and-knees position on a firm bed or the floor.   Keep your hands under your shoulders, and keep your knees under your hips. You may place padding under your knees for comfort. Let your head hang down toward your chest. Contract your abdominal muscles and point your tailbone toward the floor so your lower back becomes rounded like the back of a cat. Hold this position for 5 seconds. Slowly lift your head, let your abdominal muscles relax, and point your tailbone up toward the ceiling so your back forms a sagging arch like the back of a cow. Hold this position for 5 seconds.  Press-ups Repeat these steps 5-10 times: Lie on your abdomen (face-down) on a firm bed or the floor. Place your palms near your head, about shoulder-width apart. Keeping your back as relaxed as possible and keeping your hips on the floor, slowly straighten your arms to raise the top half of your body and lift your shoulders. Do not use your back muscles to raise  your upper torso. You may adjust the placement of your hands to make yourself more comfortable. Hold this position for 5 seconds while you keep your back relaxed. Slowly return to lying flat on the floor.  Bridges Repeat these steps 10 times: Lie on your back on a firm bed or the floor. Bend your knees so they are pointing toward the ceiling and your feet are flat on the floor. Your arms should be flat at your sides, next to your body. Tighten your buttocks muscles and lift your buttocks off the floor until your waist is at almost the same height as your knees. You should feel the muscles working in your buttocks and the back of your thighs. If you do not feel these muscles, slide your feet 1-2 inches (2.5-5 cm) farther away from your buttocks. Hold this position for 3-5 seconds. Slowly lower your hips to the starting position, and allow your buttocks muscles to relax completely. If this exercise is too easy, try doing it with your arms crossed over your chest. Abdominal crunches Repeat these steps 5-10 times: Lie on your back on a firm bed or the floor with your legs extended. Bend your knees so they are pointing toward the ceiling and your feet are flat on the floor. Cross your arms over your chest. Tip your chin slightly toward your chest without bending your neck. Tighten your abdominal muscles and slowly raise your torso high enough to lift your shoulder blades a tiny bit off the floor. Avoid raising your torso higher than that because it can put too much stress on your lower back and does not help to strengthen your abdominal muscles. Slowly return to your starting position.  Back lifts Repeat these steps 5-10 times: Lie on your abdomen (face-down) with your arms at your sides, and rest your forehead on the floor. Tighten the muscles in your legs and your buttocks. Slowly lift your chest off the floor while you keep your hips pressed to the floor. Keep the back of your head in line  with the curve in your back. Your eyes should be looking at the floor. Hold this position for 3-5 seconds. Slowly return to your starting position.  Contact a health care provider if: Your back pain or discomfort gets much worse when you do an exercise. Your worsening back pain or discomfort does not lessen within 2 hours after you exercise. If you have any of these problems, stop doing these exercises right away. Do not do them again unless your health care provider says that you  can. Get help right away if: You develop sudden, severe back pain. If this happens, stop doing the exercises right away. Do not do them again unless your health care provider says that you can. This information is not intended to replace advice given to you by your health care provider. Make sure you discuss any questions you have with your health care provider. Document Revised: 10/02/2020 Document Reviewed: 06/20/2020 Elsevier Patient Education  2023 [employer] Exercises for Chronic Knee Pain Chronic knee pain is pain that lasts longer than 3 months. For most people with chronic knee pain, exercise and weight loss is an important part of treatment. Your health care provider may want you to focus on: Strengthening the muscles that support your knee. This can take pressure off your knee and lessen pain. Preventing knee stiffness. Maintaining or increasing how far you can move your knee. Losing weight (if this applies) to take pressure off your knee, decrease your risk for injury, and make it easier for you to exercise. Your health care provider will help you develop an exercise program that matches your needs and physical abilities. Below are simple, low-impact exercises you can do at home. Ask your health care provider or a physical therapist how often you should do your exercise program and how many times to repeat each exercise. General safety tips Follow these safety tips for exercising with chronic knee  pain: Get your health care provider's approval before doing any exercises. Start slowly and stop any time an exercise causes pain. Do not exercise if your knee pain is flaring up. Warm up first. Stretching a cold muscle can cause an injury. Do 5-10 minutes of easy movement or light stretching before beginning your exercise routine. Do 5-10 minutes of low-impact activity (like walking or cycling) before starting strengthening exercises. Contact your health care provider any time you have pain during or after exercising. Exercise may cause discomfort but should not be painful. It is normal to be a little stiff or sore after exercising.  Stretching and range-of-motion exercises Front thigh stretch  Stand up straight and support your body by holding on to a chair or resting one hand on a wall. With your legs straight and close together, bend one knee to lift your heel up toward your buttocks. Using one hand for support, grab your ankle with your free hand. Pull your foot up closer toward your buttocks to feel the stretch in front of your thigh. Hold the stretch for 30 seconds. Repeat __________ times. Complete this exercise __________ times a day. Back thigh stretch  Sit on the floor with your back straight and your legs out straight in front of you. Place the palms of your hands on the floor and slide them toward your feet as you bend at the hip. Try to touch your nose to your knees and feel the stretch in the back of your thighs. Hold for 30 seconds. Repeat __________ times. Complete this exercise __________ times a day. Calf stretch  Stand facing a wall. Place the palms of your hands flat against the wall, arms extended, and lean slightly against the wall. Get into a lunge position with one leg bent at the knee and the other leg stretched out straight behind you. Keep both feet facing the wall and increase the bend in your knee while keeping the heel of the other leg flat on the  ground. You should feel the stretch in your calf. Hold for 30 seconds. Repeat __________ times. Complete this  exercise __________ times a day. Strengthening exercises Straight leg lift Lie on your back with one knee bent and the other leg out straight. Slowly lift the straight leg without bending the knee. Lift until your foot is about 12 inches (30 cm) off the floor. Hold for 3-5 seconds and slowly lower your leg. Repeat __________ times. Complete this exercise __________ times a day. Single leg dip Stand between two chairs and put both hands on the backs of the chairs for support. Extend one leg out straight with your body weight resting on the heel of the standing leg. Slowly bend your standing knee to dip your body to the level that is comfortable for you. Hold for 3-5 seconds. Repeat __________ times. Complete this exercise __________ times a day. Hamstring curls Stand straight, knees close together, facing the back of a chair. Hold on to the back of a chair with both hands. Keep one leg straight. Bend the other knee while bringing the heel up toward the buttock until the knee is bent at a 90-degree angle (right angle). Hold for 3-5 seconds. Repeat __________ times. Complete this exercise __________ times a day. Wall squat Stand straight with your back, hips, and head against a wall. Step forward one foot at a time with your back still against the wall. Your feet should be 2 feet (61 cm) from the wall at shoulder width. Keeping your back, hips, and head against the wall, slide down the wall to as close of a sitting position as you can get. Hold for 5-10 seconds, then slowly slide back up. Repeat __________ times. Complete this exercise __________ times a day. Step-ups Step up with one foot onto a sturdy platform or stool that is about 6 inches (15 cm) high. Face sideways with one foot on the platform and one on the ground. Place all your weight on the platform foot and lift your  body off the ground until your knee extends. Let your other leg hang free to the side. Hold for 3-5 seconds then slowly lower your weight down to the floor foot. Repeat __________ times. Complete this exercise __________ times a day. Contact a health care provider if: Your exercise causes pain. Your pain is worse after you exercise. Your pain prevents you from doing your exercises. This information is not intended to replace advice given to you by your health care provider. Make sure you discuss any questions you have with your health care provider. Document Revised: 08/11/2019 Document Reviewed: 04/04/2019 Elsevier Patient Education  2023 [employer].

## 2022-08-27 ENCOUNTER — Telehealth: Payer: Self-pay | Admitting: Rheumatology

## 2022-08-27 LAB — SJOGRENS SYNDROME-A EXTRACTABLE NUCLEAR ANTIBODY: SSA (Ro) (ENA) Antibody, IgG: <1 AI

## 2022-08-27 LAB — SJOGRENS SYNDROME-B EXTRACTABLE NUCLEAR ANTIBODY: SSB (La) (ENA) Antibody, IgG: 2 AI — AB

## 2022-08-27 LAB — C3 AND C4: C3 Complement: 128 mg/dL (ref 83–193)

## 2022-08-27 NOTE — Telephone Encounter (Signed)
We accept patient after stating that we are not treating fibromyalgia syndrome as we have several other autoimmune diseases to treat.  Fibromyalgia syndrome is treated by patient's primary care physician.  It would be PCPs choice to refer patient to another rheumatologist.

## 2022-08-27 NOTE — Telephone Encounter (Signed)
Archie Patten, nurse practitioner at [facility] called stating she had the patient in her office who stated Dr. Corliss Skains advised her to follow-up with PCP for the management of her fibromyalgia.  Linda Gaines states Dr. Corliss Skains diagnosed her with fibromyalgia so she should be the one to treat it and not try to pass that on to another physician to handle.  Archie Patten is very upset and recommended that the patient be referred to another rheumatologist in the practice who can treat the patient or refer her to another office.

## 2022-08-27 NOTE — Telephone Encounter (Signed)
Attempted to contact the patient and left message for patient to call the office.  

## 2022-08-28 LAB — CBC WITH DIFFERENTIAL/PLATELET
Absolute Monocytes: 445 cells/uL (ref 200–950)
Basophils Absolute: 62 cells/uL (ref 0–200)
Basophils Relative: 0.8 %
Eosinophils Absolute: 250 cells/uL (ref 15–500)
Eosinophils Relative: 3.2 %
HCT: 42.4 % (ref 35.0–45.0)
Hemoglobin: 14.7 g/dL (ref 11.7–15.5)
Lymphs Abs: 1466 cells/uL (ref 850–3900)
MCH: 29.8 pg (ref 27.0–33.0)
MCHC: 34.7 g/dL (ref 32.0–36.0)
MCV: 85.8 fL (ref 80.0–100.0)
MPV: 10.7 fL (ref 7.5–12.5)
Monocytes Relative: 5.7 %
Neutro Abs: 5577 cells/uL (ref 1500–7800)
Neutrophils Relative %: 71.5 %
Platelets: 191 10*3/uL (ref 140–400)
RBC: 4.94 10*6/uL (ref 3.80–5.10)
RDW: 13.5 % (ref 11.0–15.0)
Total Lymphocyte: 18.8 %
WBC: 7.8 10*3/uL (ref 3.8–10.8)

## 2022-08-28 LAB — COMPLETE METABOLIC PANEL WITH GFR
AG Ratio: 2.6 (calc) — ABNORMAL HIGH (ref 1.0–2.5)
ALT: 34 U/L — ABNORMAL HIGH (ref 6–29)
AST: 22 U/L (ref 10–30)
Albumin: 4.5 g/dL (ref 3.6–5.1)
Alkaline phosphatase (APISO): 47 U/L (ref 31–125)
BUN: 7 mg/dL (ref 7–25)
CO2: 25 mmol/L (ref 20–32)
Calcium: 9.1 mg/dL (ref 8.6–10.2)
Chloride: 107 mmol/L (ref 98–110)
Creat: 0.77 mg/dL (ref 0.50–0.97)
Globulin: 1.7 g/dL (calc) — ABNORMAL LOW (ref 1.9–3.7)
Glucose, Bld: 94 mg/dL (ref 65–99)
Potassium: 4.2 mmol/L (ref 3.5–5.3)
Sodium: 141 mmol/L (ref 135–146)
Total Bilirubin: 0.5 mg/dL (ref 0.2–1.2)
Total Protein: 6.2 g/dL (ref 6.1–8.1)
eGFR: 104 mL/min/{1.73_m2} (ref >=60)

## 2022-08-28 LAB — PROTEIN / CREATININE RATIO, URINE
Creatinine, Urine: 105 mg/dL (ref 20–275)
Protein/Creat Ratio: 152 mg/g creat (ref 24–184)
Protein/Creatinine Ratio: 0.152 mg/mg creat (ref 0.024–0.184)
Total Protein, Urine: 16 mg/dL (ref 5–24)

## 2022-08-28 LAB — ANTI-NUCLEAR AB-TITER (ANA TITER): ANA Titer 1: 1:80 {titer} — ABNORMAL HIGH

## 2022-08-28 LAB — SEDIMENTATION RATE: Sed Rate: 2 mm/h (ref 0–20)

## 2022-08-28 LAB — ANTI-DNA ANTIBODY, DOUBLE-STRANDED: ds DNA Ab: <1 IU/mL

## 2022-08-28 LAB — ANA: Anti Nuclear Antibody (ANA): POSITIVE — AB

## 2022-08-28 LAB — C3 AND C4: C4 Complement: 28 mg/dL (ref 15–57)

## 2022-08-28 NOTE — Telephone Encounter (Signed)
LMOM for patient to call office. 

## 2022-08-29 NOTE — Telephone Encounter (Signed)
LMOM for patient to call office. 

## 2022-08-31 NOTE — Progress Notes (Signed)
CBC normal, liver function is mildly elevated.  ANA low titer positive and stable, SSA antibody negative, SSB antibody positive and stable, urine protein creatinine ratio normal, double-stranded DNA negative, complements normal, sed rate normal.  Labs do not indicate an autoimmune disease flare.  No change in treatment advised.  Patient should avoid all NSAIDs and alcohol use due to elevated LFTs.  Please forward results to her PCP.

## 2022-09-01 NOTE — Telephone Encounter (Signed)
LMOM for patient to call office. 

## 2022-11-12 NOTE — Progress Notes (Deleted)
Office Visit Note  Patient: Linda Gaines             Date of Birth: 03/14/1991           MRN: [id number]             PCP: [facility] Referring: [facility] Visit Date: 11/26/2022 Occupation: @GUAROCC @  Subjective:  No chief complaint on file.   History of Present Illness: Linda Gaines is a 32 y.o. female ***     Activities of Daily Living:  Patient reports morning stiffness for *** {minute/hour:19697}.   Patient {ACTIONS;DENIES/REPORTS:21021675::"Denies"} nocturnal pain.  Difficulty dressing/grooming: {ACTIONS;DENIES/REPORTS:21021675::"Denies"} Difficulty climbing stairs: {ACTIONS;DENIES/REPORTS:21021675::"Denies"} Difficulty getting out of chair: {ACTIONS;DENIES/REPORTS:21021675::"Denies"} Difficulty using hands for taps, buttons, cutlery, and/or writing: {ACTIONS;DENIES/REPORTS:21021675::"Denies"}  No Rheumatology ROS completed.   PMFS History:  Patient Active Problem List   Diagnosis Date Noted   Fibromyalgia 08/07/2021   History of gastroesophageal reflux (GERD) 07/10/2021   PCOS (polycystic ovarian syndrome) 07/10/2021   History of ADHD 07/10/2021   History of bipolar disorder 07/10/2021   History of diverticulosis 07/10/2021   Hepatosplenomegaly 07/10/2021    Past Medical History:  Diagnosis Date   Anxiety    Asthma    Depression    Fibromyalgia    GERD (gastroesophageal reflux disease)    Goiter    History of bipolar disorder    History of diverticulosis    History of gastroesophageal reflux (GERD)    History of PCOS    Migraines    Osteoarthritis    Thyroid disease     Family History  Problem Relation Age of Onset   Hypertension Mother    High Cholesterol Mother    Diabetes Mother    Anxiety disorder Mother    High Cholesterol Father    Hypertension Father    Healthy Son    Healthy Son    Healthy Daughter    Fainting Daughter    Anxiety disorder Daughter    Depression Daughter     Healthy Daughter    Past Surgical History:  Procedure Laterality Date   ABDOMINAL HYSTERECTOMY     CESAREAN SECTION     x3   HERNIA REPAIR     KNEE SURGERY Left    Social History   Social History Narrative   Not on file    There is no immunization history on file for this patient.   Objective: Vital Signs: There were no vitals taken for this visit.   Physical Exam   Musculoskeletal Exam: ***  CDAI Exam: CDAI Score: -- Patient Global: --; Provider Global: -- Swollen: --; Tender: -- Joint Exam 11/26/2022   No joint exam has been documented for this visit   There is currently no information documented on the homunculus. Go to the Rheumatology activity and complete the homunculus joint exam.  Investigation: No additional findings.  Imaging: No results found.  Recent Labs: Lab Results  Component Value Date   WBC 7.8 08/26/2022   HGB 14.7 08/26/2022   PLT 191 08/26/2022   NA 141 08/26/2022   K 4.2 08/26/2022   CL 107 08/26/2022   CO2 25 08/26/2022   GLUCOSE 94 08/26/2022   BUN 7 08/26/2022   CREATININE 0.77 08/26/2022   BILITOT 0.5 08/26/2022   ALKPHOS 43 08/08/2021   AST 22 08/26/2022   ALT 34 (H) 08/26/2022   PROT 6.2 08/26/2022   ALBUMIN 4.6 08/08/2021   CALCIUM 9.1 08/26/2022    Speciality Comments: No specialty  comments available.  Procedures:  No procedures performed Allergies: Penicillin g and Amoxicillin   Assessment / Plan:     Visit Diagnoses: No diagnosis found.  Orders: No orders of the defined types were placed in this encounter.  No orders of the defined types were placed in this encounter.   Face-to-face time spent with patient was *** minutes. Greater than 50% of time was spent in counseling and coordination of care.  Follow-Up Instructions: No follow-ups on file.   Ellen Henri, CMA  Note - This record has been created using Animal nutritionist.  Chart creation errors have been sought, but may not always  have been  located. Such creation errors do not reflect on  the standard of medical care.

## 2022-11-26 ENCOUNTER — Ambulatory Visit: Payer: Medicaid Other | Admitting: Rheumatology

## 2022-11-26 DIAGNOSIS — Z8669 Personal history of other diseases of the nervous system and sense organs: Secondary | ICD-10-CM

## 2022-11-26 DIAGNOSIS — M542 Cervicalgia: Secondary | ICD-10-CM

## 2022-11-26 DIAGNOSIS — M2242 Chondromalacia patellae, left knee: Secondary | ICD-10-CM

## 2022-11-26 DIAGNOSIS — E282 Polycystic ovarian syndrome: Secondary | ICD-10-CM

## 2022-11-26 DIAGNOSIS — G8929 Other chronic pain: Secondary | ICD-10-CM

## 2022-11-26 DIAGNOSIS — R162 Hepatomegaly with splenomegaly, not elsewhere classified: Secondary | ICD-10-CM

## 2022-11-26 DIAGNOSIS — Z8269 Family history of other diseases of the musculoskeletal system and connective tissue: Secondary | ICD-10-CM

## 2022-11-26 DIAGNOSIS — M62838 Other muscle spasm: Secondary | ICD-10-CM

## 2022-11-26 DIAGNOSIS — R202 Paresthesia of skin: Secondary | ICD-10-CM

## 2022-11-26 DIAGNOSIS — M797 Fibromyalgia: Secondary | ICD-10-CM

## 2022-11-26 DIAGNOSIS — M79642 Pain in left hand: Secondary | ICD-10-CM

## 2022-11-26 DIAGNOSIS — M79672 Pain in left foot: Secondary | ICD-10-CM

## 2022-11-26 DIAGNOSIS — Z8639 Personal history of other endocrine, nutritional and metabolic disease: Secondary | ICD-10-CM

## 2022-11-26 DIAGNOSIS — R5383 Other fatigue: Secondary | ICD-10-CM

## 2022-11-26 DIAGNOSIS — R768 Other specified abnormal immunological findings in serum: Secondary | ICD-10-CM

## 2022-11-26 DIAGNOSIS — Z8719 Personal history of other diseases of the digestive system: Secondary | ICD-10-CM

## 2022-11-26 DIAGNOSIS — Z8659 Personal history of other mental and behavioral disorders: Secondary | ICD-10-CM

## 2022-11-26 DIAGNOSIS — G4709 Other insomnia: Secondary | ICD-10-CM

## 2023-03-31 ENCOUNTER — Ambulatory Visit (HOSPITAL_BASED_OUTPATIENT_CLINIC_OR_DEPARTMENT_OTHER)
Admission: EM | Admit: 2023-03-31 | Discharge: 2023-03-31 | Disposition: A | Discharge status: Home / Self Care | Payer: Medicaid Other | Attending: Internal Medicine | Admitting: Internal Medicine

## 2023-03-31 ENCOUNTER — Encounter (HOSPITAL_BASED_OUTPATIENT_CLINIC_OR_DEPARTMENT_OTHER): Payer: Self-pay

## 2023-03-31 DIAGNOSIS — J189 Pneumonia, unspecified organism: Secondary | ICD-10-CM

## 2023-03-31 HISTORY — DX: Essential (primary) hypertension: I10

## 2023-03-31 MED ORDER — LEVOFLOXACIN 750 MG PO TABS: 750.0000 mg | ORAL_TABLET | Freq: Every day | ORAL | 0 refills | Status: AC

## 2023-03-31 MED ORDER — PROMETHAZINE-DM 6.25-15 MG/5ML PO SYRP: 5.0000 mL | ORAL_SOLUTION | Freq: Four times a day (QID) | ORAL | 0 refills | Status: AC | PRN

## 2023-03-31 NOTE — ED Triage Notes (Signed)
Cough onset yesterday morning. Has noted blood tinged phlegm when coughing. No fever. Significant other being treated for pneurmonia. Noticed left ear pain before cough onset. Patient is a smoker.

## 2023-03-31 NOTE — ED Provider Notes (Signed)
[facility]    CSN: [id number] Arrival date & time: 03/31/23  1038      History   Chief Complaint Chief Complaint  Patient presents with   Cough    HPI Linda Gaines is a 32 y.o. female.   The history is provided by the patient.  Cough  Cough x 1 day associated with copious yellow bloody sputum.  States "I felt like I was drowning in mucus".  Has had chills, fatigue, shortness of breath, increased need for use of her inhaler, wheezing. Admits body aches and fatigue.  Left ear pain which has resolved.  Admits mild postnasal drip.  She is a smoker.  Past medical history of asthma.  Boyfriend currently being treated for walking pneumonia. Has anaphylactic reaction to penicillin. Past Medical History:  Diagnosis Date   Anxiety    Asthma    Depression    Fibromyalgia    GERD (gastroesophageal reflux disease)    Goiter    History of bipolar disorder    History of diverticulosis    History of gastroesophageal reflux (GERD)    History of PCOS    Hypertension    Migraines    Osteoarthritis    Thyroid disease     Patient Active Problem List   Diagnosis Date Noted   Fibromyalgia 08/07/2021   History of gastroesophageal reflux (GERD) 07/10/2021   PCOS (polycystic ovarian syndrome) 07/10/2021   History of ADHD 07/10/2021   History of bipolar disorder 07/10/2021   History of diverticulosis 07/10/2021   Hepatosplenomegaly 07/10/2021    Past Surgical History:  Procedure Laterality Date   ABDOMINAL HYSTERECTOMY     CESAREAN SECTION     x3   HERNIA REPAIR     KNEE SURGERY Left     OB History   No obstetric history on file.      Home Medications    Prior to Admission medications   Medication Sig Start Date End Date Taking? Authorizing Provider  amLODipine (NORVASC) 5 MG tablet Take 5 mg by mouth daily.   Yes [provider]  hydrochlorothiazide (MICROZIDE) 12.5 MG capsule Take 12.5 mg by mouth daily.   Yes [provider]   levofloxacin (LEVAQUIN) 750 MG tablet Take 1 tablet (750 mg total) by mouth daily for 5 days. 03/31/23 04/05/23 Yes Meliton Rattan, PA  promethazine-dextromethorphan (PROMETHAZINE-DM) 6.25-15 MG/5ML syrup Take 5 mLs by mouth 4 (four) times daily as needed for up to 5 days for cough. 03/31/23 04/05/23 Yes Taber Sweetser, Marylene Land, PA  AIMOVIG 70 MG/ML SOAJ SMARTSIG:1 SUB-Q Once a Month 06/19/21   [provider]  ALPRAZolam (NIRAVAM) 0.25 MG dissolvable tablet Take by mouth. 03/30/18   [provider]  amphetamine-dextroamphetamine (ADDERALL) 30 MG tablet Take 1 tablet by mouth 2 (two) times daily. 06/24/21   [provider]  FLUoxetine (PROZAC) 40 MG capsule Take 40 mg by mouth daily. 04/25/21   [provider]  lisinopril (ZESTRIL) 5 MG tablet Take 5 mg by mouth daily. 03/27/21   [provider]  montelukast (SINGULAIR) 10 MG tablet  01/30/20   [provider]  ondansetron (ZOFRAN-ODT) 4 MG disintegrating tablet Take 4 mg by mouth 2 (two) times daily as needed. 05/24/21   [provider]  pantoprazole (PROTONIX) 40 MG tablet Take 40 mg by mouth daily. 07/15/21   [provider]  propranolol (INDERAL) 40 MG tablet Take 40 mg by mouth daily. 04/26/21   [provider]  SYMBICORT 160-4.5 MCG/ACT inhaler SMARTSIG:2 Puff(s) By  Mouth Twice Daily 03/22/21   [provider]  VENTOLIN HFA 108 (90 Base) MCG/ACT inhaler SMARTSIG:1-2 Puff(s) By Mouth Every 4-6 Hours PRN 02/23/21   [provider]  VRAYLAR 1.5 MG capsule Take 1.5 mg by mouth daily. 05/24/21   [provider]    Family History Family History  Problem Relation Age of Onset   Hypertension Mother    High Cholesterol Mother    Diabetes Mother    Anxiety disorder Mother    High Cholesterol Father    Hypertension Father    Healthy Son    Healthy Son    Healthy Daughter    Fainting Daughter    Anxiety disorder Daughter    Depression Daughter    Healthy  Daughter     Social History Social History   Tobacco Use   Smoking status: Every Day    Current packs/day: 1.00    Types: Cigarettes    Passive exposure: Never   Smokeless tobacco: Never  Vaping Use   Vaping status: Never Used  Substance Use Topics   Alcohol use: Not Currently   Drug use: Never     Allergies   Penicillin g and Amoxicillin   Review of Systems Review of Systems  Respiratory:  Positive for cough.      Physical Exam Triage Vital Signs ED Triage Vitals  Encounter Vitals Group     BP --      Systolic BP Percentile --      Diastolic BP Percentile --      Pulse Rate 03/31/23 1044 85     Resp --      Temp 03/31/23 1044 98.6 F (37 C)     Temp Source 03/31/23 1044 Oral     SpO2 --      Weight 03/31/23 1047 198 lb (89.8 kg)     Height --      Head Circumference --      Peak Flow --      Pain Score 03/31/23 1046 8     Pain Loc --      Pain Education --      Exclude from Growth Chart --    No data found.  Updated Vital Signs Pulse 85   Temp 98.6 F (37 C) (Oral)   Wt 198 lb (89.8 kg)   BMI 35.07 kg/m   Visual Acuity Right Eye Distance:   Left Eye Distance:   Bilateral Distance:    Right Eye Near:   Left Eye Near:    Bilateral Near:     Physical Exam Constitutional:      Appearance: She is obese. She is not ill-appearing.  HENT:     Head: Normocephalic and atraumatic.     Right Ear: Tympanic membrane and ear canal normal.     Left Ear: Tympanic membrane and ear canal normal.     Nose: No rhinorrhea.     Mouth/Throat:     Pharynx: Oropharynx is clear. No posterior oropharyngeal erythema.  Eyes:     Conjunctiva/sclera: Conjunctivae normal.  Cardiovascular:     Rate and Rhythm: Regular rhythm.     Heart sounds: Normal heart sounds.  Pulmonary:     Effort: Pulmonary effort is normal.     Breath sounds: Rhonchi present. No wheezing or rales.  Musculoskeletal:     Cervical back: Neck supple.  Lymphadenopathy:     Cervical: No  cervical adenopathy.  Skin:    General: Skin is warm and dry.  Neurological:  Mental Status: She is oriented to person, place, and time.      UC Treatments / Results  Labs (all labs ordered are listed, but only abnormal results are displayed) Labs Reviewed - No data to display  EKG   Radiology No results found.  Procedures Procedures (including critical care time)  Medications Ordered in UC Medications - No data to display  Initial Impression / Assessment and Plan / UC Course  I have reviewed the triage vital signs and the nursing notes.  Pertinent labs & imaging results that were available during my care of the patient were reviewed by me and considered in my medical decision making (see chart for details).     32 year old female with productive cough, chills, fatigue, shortness of breath, recently exposed to walking pneumonia.  Has severe penicillin allergy will treat with Levaquin.  Warning signs and follow-up reviewed with patient follow-up with PCP in 1 week, go to ED for high fever, shaking chills, chest pain, worsening shortness of breath, concerns Final Clinical Impressions(s) / UC Diagnoses   Final diagnoses:  Community acquired pneumonia, unspecified laterality   Discharge Instructions   None    ED Prescriptions     Medication Sig Dispense Auth. Provider   levofloxacin (LEVAQUIN) 750 MG tablet Take 1 tablet (750 mg total) by mouth daily for 5 days. 5 tablet Meliton Rattan, [place]   promethazine-dextromethorphan (PROMETHAZINE-DM) 6.25-15 MG/5ML syrup Take 5 mLs by mouth 4 (four) times daily as needed for up to 5 days for cough. 118 mL Meliton Rattan, [place]      PDMP not reviewed this encounter.   Meliton Rattan, [place] 03/31/23 1138

## 2023-07-30 ENCOUNTER — Other Ambulatory Visit: Payer: Self-pay | Admitting: Specialist

## 2023-07-30 DIAGNOSIS — K760 Fatty (change of) liver, not elsewhere classified: Secondary | ICD-10-CM

## 2023-08-11 ENCOUNTER — Other Ambulatory Visit

## 2023-09-02 ENCOUNTER — Ambulatory Visit
Admission: RE | Admit: 2023-09-02 | Discharge: 2023-09-02 | Disposition: A | Discharge status: Home / Self Care | Source: Ambulatory Visit | Attending: Specialist | Admitting: Specialist

## 2023-09-02 DIAGNOSIS — K760 Fatty (change of) liver, not elsewhere classified: Secondary | ICD-10-CM
